# Patient Record
Sex: Male | Born: 1980 | Race: White | Hispanic: No
Health system: Southern US, Community
[De-identification: ages and names within clinical notes are randomized; demographics above are authoritative.]

## PROBLEM LIST (undated history)

## (undated) DIAGNOSIS — F1910 Other psychoactive substance abuse, uncomplicated: Secondary | ICD-10-CM

## (undated) DIAGNOSIS — B958 Unspecified staphylococcus as the cause of diseases classified elsewhere: Secondary | ICD-10-CM

## (undated) DIAGNOSIS — F419 Anxiety disorder, unspecified: Secondary | ICD-10-CM

## (undated) DIAGNOSIS — B1920 Unspecified viral hepatitis C without hepatic coma: Secondary | ICD-10-CM

## (undated) HISTORY — PX: FRACTURE SURGERY: SHX138

## (undated) HISTORY — PX: DENTAL SURGERY: SHX609

## (undated) HISTORY — PX: TONSILLECTOMY: SUR1361

---
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
## 2005-11-04 ENCOUNTER — Emergency Department (HOSPITAL_COMMUNITY): Admission: EM | Admit: 2005-11-04 | Discharge: 2005-11-04 | Payer: Self-pay | Admitting: Emergency Medicine

## 2005-11-05 ENCOUNTER — Emergency Department (HOSPITAL_COMMUNITY): Admission: EM | Admit: 2005-11-05 | Discharge: 2005-11-05 | Payer: Self-pay | Admitting: Emergency Medicine

## 2008-06-15 ENCOUNTER — Emergency Department (HOSPITAL_COMMUNITY): Admission: EM | Admit: 2008-06-15 | Discharge: 2008-06-16 | Payer: Self-pay | Admitting: Emergency Medicine

## 2009-04-13 ENCOUNTER — Emergency Department (HOSPITAL_COMMUNITY): Admission: EM | Admit: 2009-04-13 | Discharge: 2009-04-14 | Payer: Self-pay | Admitting: Emergency Medicine

## 2009-04-14 ENCOUNTER — Inpatient Hospital Stay (HOSPITAL_COMMUNITY): Admission: RE | Admit: 2009-04-14 | Discharge: 2009-04-17 | Payer: Self-pay | Admitting: Psychiatry

## 2009-04-14 ENCOUNTER — Ambulatory Visit: Payer: Self-pay | Admitting: Psychiatry

## 2009-04-18 ENCOUNTER — Emergency Department (HOSPITAL_COMMUNITY): Admission: EM | Admit: 2009-04-18 | Discharge: 2009-04-18 | Payer: Self-pay | Admitting: Emergency Medicine

## 2009-10-12 ENCOUNTER — Emergency Department (HOSPITAL_COMMUNITY): Admission: EM | Admit: 2009-10-12 | Discharge: 2009-10-13 | Payer: Self-pay | Admitting: Emergency Medicine

## 2009-10-13 ENCOUNTER — Emergency Department (HOSPITAL_COMMUNITY): Admission: EM | Admit: 2009-10-13 | Discharge: 2009-10-13 | Payer: Self-pay | Admitting: Emergency Medicine

## 2009-12-22 ENCOUNTER — Emergency Department (HOSPITAL_COMMUNITY): Admission: EM | Admit: 2009-12-22 | Discharge: 2009-12-23 | Payer: Self-pay | Admitting: Emergency Medicine

## 2009-12-23 ENCOUNTER — Emergency Department (HOSPITAL_COMMUNITY): Admission: EM | Admit: 2009-12-23 | Discharge: 2009-12-23 | Payer: Self-pay | Admitting: Emergency Medicine

## 2009-12-25 ENCOUNTER — Emergency Department (HOSPITAL_COMMUNITY): Admission: EM | Admit: 2009-12-25 | Discharge: 2009-12-25 | Payer: Self-pay | Admitting: Emergency Medicine

## 2010-05-09 LAB — BASIC METABOLIC PANEL
BUN: 11 mg/dL (ref 6–23)
CO2: 26 mEq/L (ref 19–32)
Calcium: 8.9 mg/dL (ref 8.4–10.5)
Chloride: 104 mEq/L (ref 96–112)
Chloride: 105 mEq/L (ref 96–112)
Creatinine, Ser: 0.77 mg/dL (ref 0.4–1.5)
GFR calc Af Amer: >60 mL/min (ref >60)
GFR calc Af Amer: >60 mL/min (ref >60)
GFR calc non Af Amer: >60 mL/min (ref >60)
Glucose, Bld: 91 mg/dL (ref 70–99)
Potassium: 3.7 mEq/L (ref 3.5–5.1)
Potassium: 4 mEq/L (ref 3.5–5.1)
Sodium: 137 mEq/L (ref 135–145)

## 2010-05-09 LAB — DIFFERENTIAL
Eosinophils Absolute: 0.2 10*3/uL (ref 0.0–0.7)
Eosinophils Relative: 1 % (ref 0–5)
Eosinophils Relative: 2 % (ref 0–5)
Lymphocytes Relative: 12 % (ref 12–46)
Lymphs Abs: 1.8 10*3/uL (ref 0.7–4.0)
Lymphs Abs: 1.8 10*3/uL (ref 0.7–4.0)
Monocytes Absolute: 1.1 10*3/uL — ABNORMAL HIGH (ref 0.1–1.0)
Monocytes Relative: 4 % (ref 3–12)

## 2010-05-09 LAB — CBC
HCT: 42.3 % (ref 39.0–52.0)
Hemoglobin: 14.8 g/dL (ref 13.0–17.0)
MCH: 31.7 pg (ref 26.0–34.0)
MCV: 91.3 fL (ref 78.0–100.0)
MCV: 91.5 fL (ref 78.0–100.0)
Platelets: 140 10*3/uL — ABNORMAL LOW (ref 150–400)
RBC: 4.63 MIL/uL (ref 4.22–5.81)
RBC: 4.68 MIL/uL (ref 4.22–5.81)
WBC: 15.7 10*3/uL — ABNORMAL HIGH (ref 4.0–10.5)

## 2010-05-09 LAB — CULTURE, BLOOD (ROUTINE X 2)

## 2010-05-11 LAB — CBC
MCH: 31.3 pg (ref 26.0–34.0)
MCHC: 34.6 g/dL (ref 30.0–36.0)
Platelets: 177 10*3/uL (ref 150–400)
Platelets: 188 10*3/uL (ref 150–400)
RBC: 4.88 MIL/uL (ref 4.22–5.81)
RDW: 13.2 % (ref 11.5–15.5)
RDW: 13.5 % (ref 11.5–15.5)
WBC: 7.7 10*3/uL (ref 4.0–10.5)

## 2010-05-11 LAB — COMPREHENSIVE METABOLIC PANEL
ALT: 25 U/L (ref 0–53)
Albumin: 4.6 g/dL (ref 3.5–5.2)
Alkaline Phosphatase: 55 U/L (ref 39–117)
Calcium: 9.2 mg/dL (ref 8.4–10.5)
GFR calc Af Amer: >60 mL/min (ref >60)
Potassium: 3.9 mEq/L (ref 3.5–5.1)
Sodium: 140 mEq/L (ref 135–145)
Total Protein: 7.2 g/dL (ref 6.0–8.3)

## 2010-05-11 LAB — DIFFERENTIAL
Basophils Absolute: 0 10*3/uL (ref 0.0–0.1)
Basophils Relative: 0 % (ref 0–1)
Basophils Relative: 1 % (ref 0–1)
Eosinophils Absolute: 0.2 10*3/uL (ref 0.0–0.7)
Eosinophils Absolute: 0.3 10*3/uL (ref 0.0–0.7)
Lymphs Abs: 2.3 10*3/uL (ref 0.7–4.0)
Lymphs Abs: 3 10*3/uL (ref 0.7–4.0)
Monocytes Absolute: 0.5 10*3/uL (ref 0.1–1.0)
Monocytes Relative: 6 % (ref 3–12)
Neutrophils Relative %: 64 % (ref 43–77)

## 2010-05-11 LAB — RAPID URINE DRUG SCREEN, HOSP PERFORMED
Amphetamines: NOT DETECTED
Amphetamines: NOT DETECTED
Barbiturates: NOT DETECTED
Benzodiazepines: POSITIVE — AB
Cocaine: NOT DETECTED
Opiates: POSITIVE — AB
Tetrahydrocannabinol: POSITIVE — AB

## 2010-05-11 LAB — ETHANOL
Alcohol, Ethyl (B): 111 mg/dL — ABNORMAL HIGH (ref 0–10)
Alcohol, Ethyl (B): <5 mg/dL (ref 0–10)

## 2010-05-11 LAB — BASIC METABOLIC PANEL
BUN: 11 mg/dL (ref 6–23)
CO2: 27 mEq/L (ref 19–32)
Calcium: 9.6 mg/dL (ref 8.4–10.5)
Creatinine, Ser: 1.02 mg/dL (ref 0.4–1.5)
GFR calc Af Amer: >60 mL/min (ref >60)

## 2010-05-16 LAB — COMPREHENSIVE METABOLIC PANEL
ALT: 24 U/L (ref 0–53)
AST: 21 U/L (ref 0–37)
Alkaline Phosphatase: 51 U/L (ref 39–117)
Alkaline Phosphatase: 57 U/L (ref 39–117)
BUN: 11 mg/dL (ref 6–23)
CO2: 27 mEq/L (ref 19–32)
Calcium: 9.6 mg/dL (ref 8.4–10.5)
Chloride: 105 mEq/L (ref 96–112)
Creatinine, Ser: 0.83 mg/dL (ref 0.4–1.5)
GFR calc Af Amer: >60 mL/min (ref >60)
GFR calc non Af Amer: >60 mL/min (ref >60)
GFR calc non Af Amer: >60 mL/min (ref >60)
Glucose, Bld: 99 mg/dL (ref 70–99)
Potassium: 3.5 mEq/L (ref 3.5–5.1)
Potassium: 4.3 mEq/L (ref 3.5–5.1)
Sodium: 140 mEq/L (ref 135–145)
Total Bilirubin: 0.2 mg/dL — ABNORMAL LOW (ref 0.3–1.2)

## 2010-05-16 LAB — CBC
HCT: 42.1 % (ref 39.0–52.0)
Hemoglobin: 14.6 g/dL (ref 13.0–17.0)
Hemoglobin: 15.6 g/dL (ref 13.0–17.0)
MCHC: 34.7 g/dL (ref 30.0–36.0)
MCV: 92 fL (ref 78.0–100.0)
Platelets: 175 10*3/uL (ref 150–400)
RBC: 4.93 MIL/uL (ref 4.22–5.81)
WBC: 10.4 10*3/uL (ref 4.0–10.5)
WBC: 8 10*3/uL (ref 4.0–10.5)

## 2010-05-16 LAB — LIPASE, BLOOD: Lipase: 37 U/L (ref 11–59)

## 2010-05-16 LAB — ETHANOL
Alcohol, Ethyl (B): <5 mg/dL (ref 0–10)
Alcohol, Ethyl (B): <5 mg/dL (ref 0–10)

## 2010-05-16 LAB — POCT I-STAT, CHEM 8
BUN: 8 mg/dL (ref 6–23)
Calcium, Ion: 1.17 mmol/L (ref 1.12–1.32)
Chloride: 104 mEq/L (ref 96–112)
Creatinine, Ser: 0.9 mg/dL (ref 0.4–1.5)
Glucose, Bld: 100 mg/dL — ABNORMAL HIGH (ref 70–99)
TCO2: 29 mmol/L (ref 0–100)

## 2010-05-16 LAB — DIFFERENTIAL
Basophils Absolute: 0 10*3/uL (ref 0.0–0.1)
Basophils Relative: 0 % (ref 0–1)
Basophils Relative: 0 % (ref 0–1)
Eosinophils Absolute: 0 10*3/uL (ref 0.0–0.7)
Eosinophils Relative: 0 % (ref 0–5)
Lymphocytes Relative: 37 % (ref 12–46)
Lymphs Abs: 1.6 10*3/uL (ref 0.7–4.0)
Monocytes Relative: 6 % (ref 3–12)
Neutro Abs: 4.2 10*3/uL (ref 1.7–7.7)
Neutrophils Relative %: 52 % (ref 43–77)

## 2010-05-16 LAB — RAPID URINE DRUG SCREEN, HOSP PERFORMED
Barbiturates: NOT DETECTED
Barbiturates: NOT DETECTED
Benzodiazepines: POSITIVE — AB
Cocaine: NOT DETECTED
Cocaine: NOT DETECTED
Opiates: NOT DETECTED

## 2010-06-06 LAB — DIFFERENTIAL
Basophils Absolute: 0 10*3/uL (ref 0.0–0.1)
Eosinophils Relative: 6 % — ABNORMAL HIGH (ref 0–5)
Lymphocytes Relative: 35 % (ref 12–46)
Lymphs Abs: 2.6 10*3/uL (ref 0.7–4.0)
Neutro Abs: 4 10*3/uL (ref 1.7–7.7)

## 2010-06-06 LAB — URINALYSIS, ROUTINE W REFLEX MICROSCOPIC
Glucose, UA: NEGATIVE mg/dL
Nitrite: NEGATIVE
Specific Gravity, Urine: 1.02 (ref 1.005–1.030)
pH: 6.5 (ref 5.0–8.0)

## 2010-06-06 LAB — CBC
HCT: 44 % (ref 39.0–52.0)
MCHC: 35.5 g/dL (ref 30.0–36.0)
MCV: 90.8 fL (ref 78.0–100.0)
Platelets: 176 10*3/uL (ref 150–400)

## 2010-06-06 LAB — COMPREHENSIVE METABOLIC PANEL
AST: 18 U/L (ref 0–37)
Albumin: 4.5 g/dL (ref 3.5–5.2)
BUN: 7 mg/dL (ref 6–23)
CO2: 29 mEq/L (ref 19–32)
Calcium: 9.6 mg/dL (ref 8.4–10.5)
Chloride: 102 mEq/L (ref 96–112)
Creatinine, Ser: 0.88 mg/dL (ref 0.4–1.5)
GFR calc Af Amer: >60 mL/min (ref >60)
GFR calc non Af Amer: >60 mL/min (ref >60)
Total Bilirubin: 0.3 mg/dL (ref 0.3–1.2)
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
## 2011-02-25 IMAGING — CR DG ELBOW COMPLETE 3+V*R*
4 series · 4 of 4 positions shown · non-contrast
Comparison: [DATE]

CLINICAL DATA: Insect bite.  Redness.  Cellulitis.

RIGHT ELBOW - COMPLETE 3+ VIEW

[x elbow joint ap right]
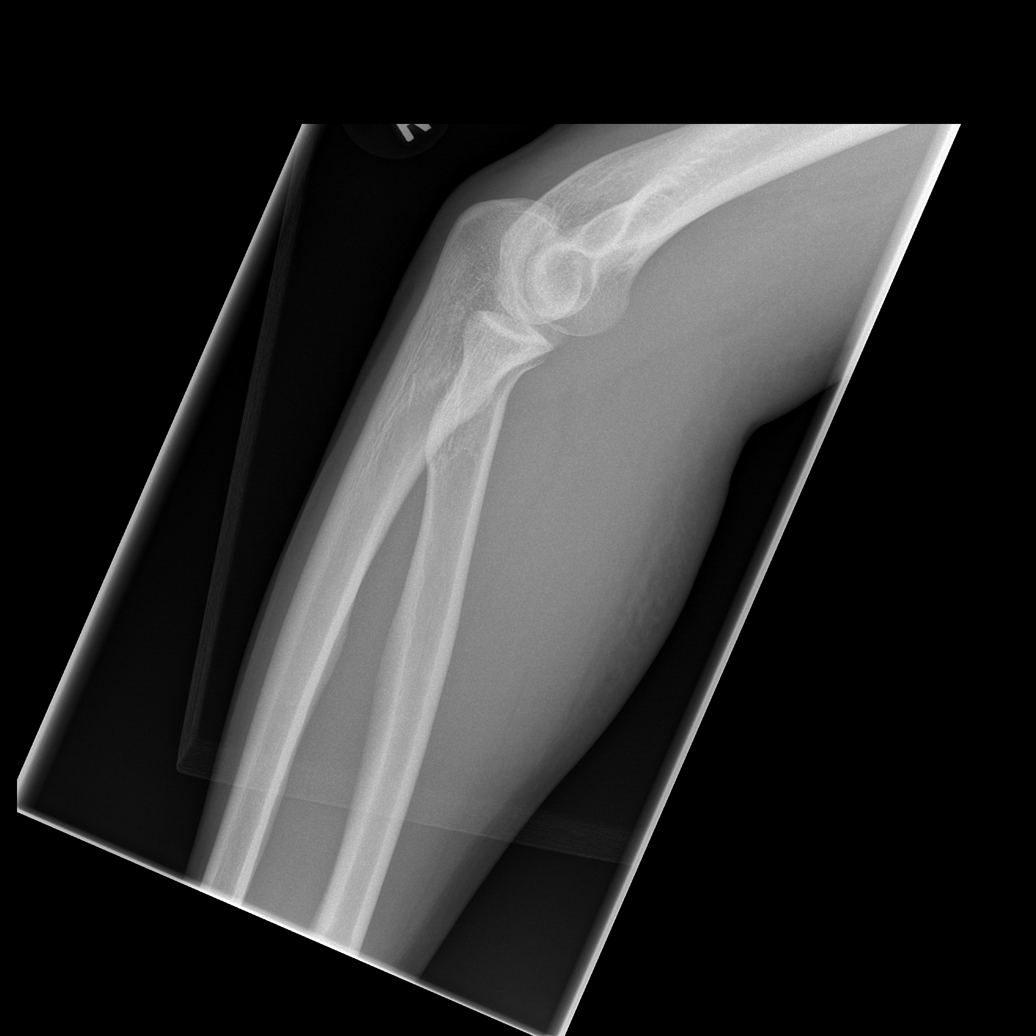

[x elbow joint obl. right]
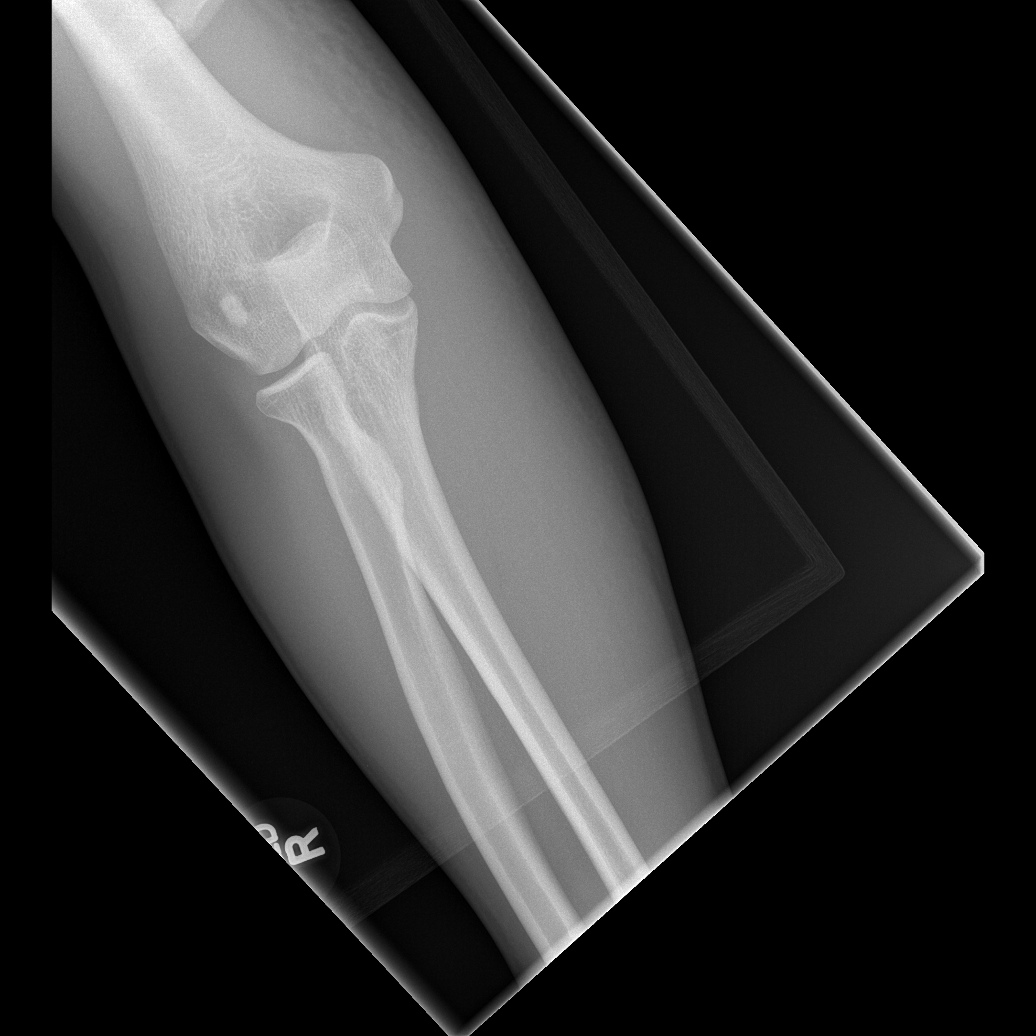

[x elbow joint lat right (1 of 2)]
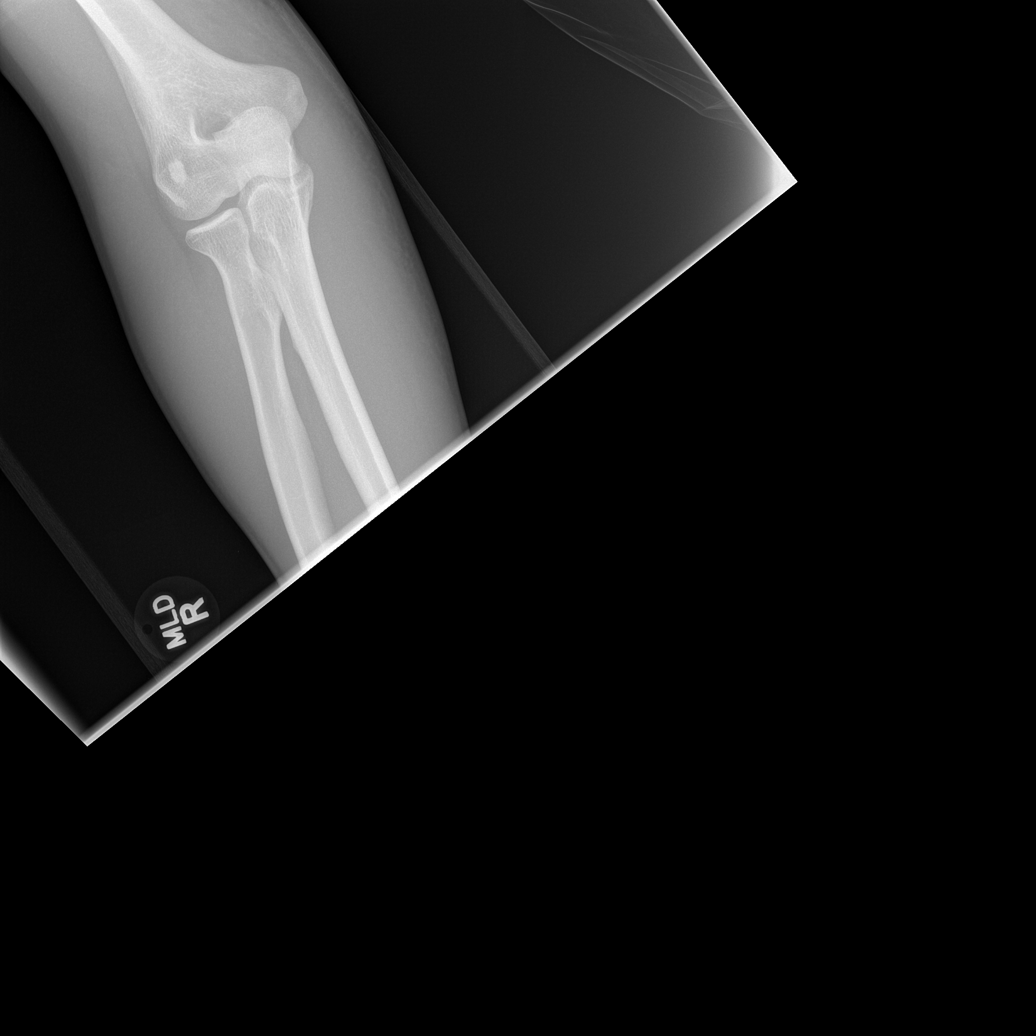

[x elbow joint lat right (2 of 2)]
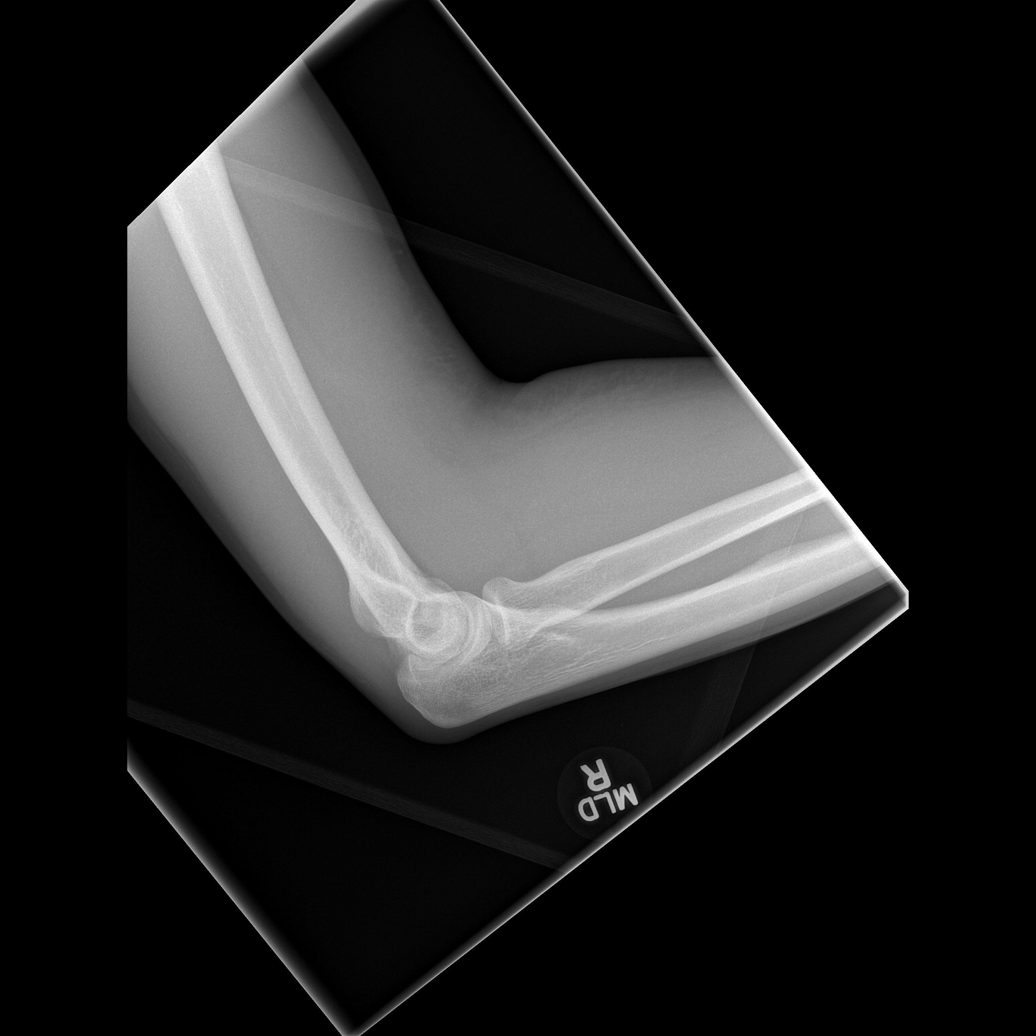

[4 of 4 positions shown; findings below may reference images not displayed]

FINDINGS: No discernible joint effusion.  No acute bony finding.
Benign bone island in the lateral humerus.
IMPRESSION: No acute finding

## 2012-03-22 ENCOUNTER — Emergency Department (HOSPITAL_COMMUNITY)
Admission: EM | Admit: 2012-03-22 | Discharge: 2012-03-22 | Disposition: A | Discharge status: Home / Self Care | Payer: Self-pay | Attending: Emergency Medicine | Admitting: Emergency Medicine

## 2012-03-22 DIAGNOSIS — K089 Disorder of teeth and supporting structures, unspecified: Secondary | ICD-10-CM | POA: Insufficient documentation

## 2012-03-22 DIAGNOSIS — K0381 Cracked tooth: Secondary | ICD-10-CM | POA: Insufficient documentation

## 2012-03-22 DIAGNOSIS — K0889 Other specified disorders of teeth and supporting structures: Secondary | ICD-10-CM

## 2012-03-22 MED ORDER — OXYCODONE-ACETAMINOPHEN 5-325 MG PO TABS: 1.0000 | ORAL_TABLET | ORAL | Status: DC | PRN

## 2012-03-22 MED ORDER — OXYCODONE-ACETAMINOPHEN 5-325 MG PO TABS
2.0000 | ORAL_TABLET | Freq: Once | ORAL | Status: AC
  Administered 2012-03-22: 2 via ORAL
  Filled 2012-03-22: qty 2

## 2012-03-22 MED ORDER — BUPIVACAINE HCL 0.25 % IJ SOLN: 5.0000 mL | Freq: Once | INTRAMUSCULAR | Status: DC

## 2012-03-22 MED ORDER — BUPIVACAINE-EPINEPHRINE PF 0.5-1:200000 % IJ SOLN
1.8000 mL | Freq: Once | INTRAMUSCULAR | Status: DC
  Filled 2012-03-22: qty 1.8

## 2012-03-22 MED ORDER — CLINDAMYCIN HCL 150 MG PO CAPS: 300.0000 mg | ORAL_CAPSULE | Freq: Three times a day (TID) | ORAL | Status: DC

## 2012-03-22 NOTE — ED Notes (Signed)
Pt c/o dental pain to L side of mouth. Pt states he thinks his wisdom teeth are infected. Pt reports pain for last 2-3 weeks. Pt states he does not have a dentist. Pt has been taking Motrin and Goody's with no relief. Pt also states that he has a tooth on the R side of his mouth where the filling came out and this area is also painful.

## 2012-03-22 NOTE — ED Provider Notes (Signed)
Medical screening examination/treatment/procedure(s) were performed by non-physician practitioner and as supervising physician I was immediately available for consultation/collaboration.  Gerhard Munch, MD 03/22/12 [phone]

## 2012-03-22 NOTE — ED Provider Notes (Signed)
History   This chart was scribed for non-physician practitioner working with Gerhard Munch, MD by Frederik Pear, ED Scribe. This patient was seen in room WTR9/WTR9 and the patient's care was started at 2008.   CSN: [id number]  Arrival date & time 03/22/12  1478   First MD Initiated Contact with Patient 03/22/12 2008      Chief Complaint  Patient presents with  . Dental Pain    (Consider location/radiation/quality/duration/timing/severity/associated sxs/prior treatment) HPI  Danny Campbell is a 32 y.o. male who presents to the Emergency Department with a chief complaint of gradually worsening left-sided dental pain that began 3 weeks ago. He reports that he believes that his top and bottom wisdom are infected. He also complains of a broken tooth to the top right of his mouth that began after ate a piece of candy that broke his filling. He reports that small pieces of the tooth have been breaking off over the past few weeks. He states he has been treating with ibuprofen and BC powder at home that was providing relief at first, but is no longer providing relief. 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  No past medical history on file.  No past surgical history on file.  No family history on file.  History  Substance Use Topics  . Smoking status: Not on file  . Smokeless tobacco: Not on file  . Alcohol Use: Not on file      Review of Systems A complete 10 system review of systems was obtained and all systems are negative except as noted in the HPI and PMH.   Allergies  Benadryl and Penicillins  Home Medications   Current Outpatient Rx  Name  Route  Sig  Dispense  Refill  . ASPIRIN-ACETAMINOPHEN-CAFFEINE 500-325-65 MG PO PACK   Oral   Take 1 Package by mouth every 6 (six) hours as needed. For pain         . IBUPROFEN 200 MG PO TABS   Oral   Take 800 mg by mouth every 6 (six) hours as needed. For pain           BP 163/97  Pulse 88   Temp 98.1 F (36.7 C) (Oral)  Resp 16  SpO2 99%  Physical Exam  Nursing note and vitals reviewed. Constitutional: He is oriented to person, place, and time. He appears well-developed and well-nourished. No distress.  HENT:  Head: Normocephalic and atraumatic.  Mouth/Throat: Uvula is midline. No tonsillar abscesses.         No signs of gingival or para-tonsillar abscesses. Airway is intact.  Eyes: EOM are normal. Pupils are equal, round, and reactive to light.  Neck: Normal range of motion. Neck supple. No tracheal deviation present.  Cardiovascular: Normal rate.   Pulmonary/Chest: Effort normal. No respiratory distress.  Abdominal: Soft. He exhibits no distension.  Musculoskeletal: Normal range of motion. He exhibits no edema.  Neurological: He is alert and oriented to person, place, and time.  Skin: Skin is warm and dry.  Psychiatric: He has a normal mood and affect. His behavior is normal.    ED Course  Procedures (including critical care time)  DIAGNOSTIC STUDIES: Oxygen Saturation is 99% on room air, normal by my interpretation.    COORDINATION OF CARE:  20:20- Discussed planned course of treatment with the patient, including clindamyacin, a dental block, and pain medication, who is agreeable at this time.  20:30- Medication Orders- oxycodone-acetaminophen (percocet/roxicet) 5-325 mg per tablet  2 tablet- once, bupivacaine (marcaine) 0.25% (with pre) injection 5 ml- once.   Labs Reviewed - No data to display No results found.   1. Pain, dental       MDM  32 year old male with uncomplicated dental pain. Treated with dental block and Percocet. Will discharge the patient with clindamycin and Percocet. Encouraged patient to followup with the dentist. Resource have been given.  Patient understands and agrees with the plan. He is stable and ready for discharge.  I personally performed the services described in this documentation, which was scribed in my presence. The  recorded information has been reviewed and is accurate.         Roxy Horseman, PA-C 03/22/12 2053

## 2012-03-22 NOTE — ED Notes (Signed)
Pt has a ride home.  

## 2012-03-29 ENCOUNTER — Encounter (HOSPITAL_COMMUNITY): Payer: Self-pay

## 2012-03-29 ENCOUNTER — Emergency Department (HOSPITAL_COMMUNITY)
Admission: EM | Admit: 2012-03-29 | Discharge: 2012-03-29 | Disposition: A | Discharge status: Home / Self Care | Payer: Self-pay | Attending: Emergency Medicine | Admitting: Emergency Medicine

## 2012-03-29 DIAGNOSIS — F17200 Nicotine dependence, unspecified, uncomplicated: Secondary | ICD-10-CM | POA: Insufficient documentation

## 2012-03-29 DIAGNOSIS — K08409 Partial loss of teeth, unspecified cause, unspecified class: Secondary | ICD-10-CM

## 2012-03-29 DIAGNOSIS — K089 Disorder of teeth and supporting structures, unspecified: Secondary | ICD-10-CM | POA: Insufficient documentation

## 2012-03-29 DIAGNOSIS — G8918 Other acute postprocedural pain: Secondary | ICD-10-CM | POA: Insufficient documentation

## 2012-03-29 DIAGNOSIS — K122 Cellulitis and abscess of mouth: Secondary | ICD-10-CM

## 2012-03-29 DIAGNOSIS — Y849 Medical procedure, unspecified as the cause of abnormal reaction of the patient, or of later complication, without mention of misadventure at the time of the procedure: Secondary | ICD-10-CM | POA: Insufficient documentation

## 2012-03-29 DIAGNOSIS — Z79899 Other long term (current) drug therapy: Secondary | ICD-10-CM | POA: Insufficient documentation

## 2012-03-29 DIAGNOSIS — T8140XA Infection following a procedure, unspecified, initial encounter: Secondary | ICD-10-CM | POA: Insufficient documentation

## 2012-03-29 MED ORDER — BUPIVACAINE-EPINEPHRINE PF 0.5-1:200000 % IJ SOLN
1.8000 mL | Freq: Once | INTRAMUSCULAR | Status: DC
  Filled 2012-03-29: qty 1.8

## 2012-03-29 MED ORDER — OXYCODONE-ACETAMINOPHEN 5-325 MG PO TABS
2.0000 | ORAL_TABLET | Freq: Once | ORAL | Status: AC
  Administered 2012-03-29: 2 via ORAL
  Filled 2012-03-29: qty 2

## 2012-03-29 MED ORDER — CLINDAMYCIN HCL 300 MG PO CAPS: 300.0000 mg | ORAL_CAPSULE | Freq: Three times a day (TID) | ORAL | Status: DC

## 2012-03-29 MED ORDER — BUPIVACAINE HCL 0.5 % IJ SOLN: 1.8000 mL | Freq: Once | INTRAMUSCULAR | Status: DC

## 2012-03-29 MED ORDER — CHLORHEXIDINE GLUCONATE 0.12 % MT SOLN: 15.0000 mL | Freq: Two times a day (BID) | OROMUCOSAL | Status: DC

## 2012-03-29 MED ORDER — KETOROLAC TROMETHAMINE 60 MG/2ML IM SOLN
60.0000 mg | Freq: Once | INTRAMUSCULAR | Status: AC
  Administered 2012-03-29: 60 mg via INTRAMUSCULAR
  Filled 2012-03-29: qty 2

## 2012-03-29 MED ORDER — OXYCODONE-ACETAMINOPHEN 10-325 MG PO TABS: 1.0000 | ORAL_TABLET | Freq: Four times a day (QID) | ORAL | Status: DC | PRN

## 2012-03-29 NOTE — ED Notes (Signed)
Patient c/o dental pain s/p 6 tooth extractions that were done  5 days ago.

## 2012-03-31 NOTE — ED Provider Notes (Signed)
History     CSN: [id number]  Arrival date & time 03/29/12  1142   First MD Initiated Contact with Patient 03/29/12 1224      Chief Complaint  Patient presents with  . Dental Pain    (Consider location/radiation/quality/duration/timing/severity/associated sxs/prior treatment) The history is provided by the patient and medical records. No language interpreter was used.   32 y/o male with hx poor dentition reports to ed c/o dental pain after tooth extraction. Patient was seen here at the ED on 03/22/2012 for dental pain.  He was seen by Dr. Hubert Azure for tooth extraction and complains today of severe pain and purulent discharge.  Pateint states that he has been taking clindamysin as directed.  He has not been smoking.  Patient states that his sutures fell out the day after extraction. He states that he had an oral temp or 102.4 F last night.  Denies difficulty breathing or swallowing.  History reviewed. No pertinent past medical history.  Past Surgical History  Procedure Date  . Dental surgery     History reviewed. No pertinent family history.  History  Substance Use Topics  . Smoking status: Current Every Day Smoker  . Smokeless tobacco: Never Used  . Alcohol Use: No      Review of Systems Ten systems reviewed and are negative for acute change, except as noted in the HPI.   Allergies  Benadryl and Penicillins  Home Medications   Current Outpatient Rx  Name  Route  Sig  Dispense  Refill  . ASPIRIN-ACETAMINOPHEN-CAFFEINE 500-325-65 MG PO PACK   Oral   Take 1 Package by mouth every 6 (six) hours as needed. For pain         . CLINDAMYCIN HCL 150 MG PO CAPS   Oral   Take 2 capsules (300 mg total) by mouth 3 (three) times daily.   60 capsule   0   . IBUPROFEN 200 MG PO TABS   Oral   Take 800 mg by mouth every 6 (six) hours as needed. For pain         . OXYCODONE-ACETAMINOPHEN 5-325 MG PO TABS   Oral   Take 1 tablet by mouth every 4 (four) hours as needed  for pain.   15 tablet   0   . CHLORHEXIDINE GLUCONATE 0.12 % MT SOLN   Mouth/Throat   Use as directed 15 mLs in the mouth or throat 2 (two) times daily.   120 mL   0   . CLINDAMYCIN HCL 300 MG PO CAPS   Oral   Take 1 capsule (300 mg total) by mouth 3 (three) times daily.   30 capsule   0   . OXYCODONE-ACETAMINOPHEN 10-325 MG PO TABS   Oral   Take 1-2 tablets by mouth every 6 (six) hours as needed for pain.   8 tablet   0     BP 136/79  Pulse 77  Temp 97.5 F (36.4 C) (Oral)  Resp 18  SpO2 98%  Physical Exam  Nursing note and vitals reviewed. Constitutional:       Appears in pain and non-toxic. Smells of cigarette smoke.    HENT:  Head: Normocephalic and atraumatic. No trismus in the jaw.  Mouth/Throat: Uvula is midline.         Five open toot sockets.  There is purulent drainage from the lower left sockets. No trismus.  Uvula midline.  No extension to the pharynx. Airway patent.  Eyes: Conjunctivae normal are normal.  No scleral icterus.  Neck: Normal range of motion. Neck supple.  Cardiovascular: Normal rate, regular rhythm and normal heart sounds.   Pulmonary/Chest: Effort normal and breath sounds normal. No respiratory distress.  Abdominal: Soft. There is no tenderness.  Musculoskeletal: He exhibits no edema.  Neurological: He is alert.  Skin: Skin is warm and dry.  Psychiatric: His behavior is normal.    ED Course  Procedures (including critical care time)  Labs Reviewed - No data to display No results found.   1. Status post tooth extraction   2. Infection of mouth       MDM  Patient with purulent dc and pain.  I have messaged Dr. Jeanice Lim about his visit.  Ptient is to continue taking his clindamycin .  Will add chlorhexidine mouth was and refill pain meds.  Patient will follow up with oral surgeon tomorrow.  At this time there does not appear to be any evidence of an acute emergency medical condition and the patient appears stable for discharge  with appropriate outpatient follow up.Diagnosis was discussed with patient who verbalizes understanding and is agreeable to discharge. Pt case discussed with Dr. Patria Mane who agrees with my plan.        Arthor Captain, PA-C 03/31/12 1247

## 2012-04-01 NOTE — ED Provider Notes (Signed)
Medical screening examination/treatment/procedure(s) were conducted as a shared visit with non-physician practitioner(s) and myself.  I personally evaluated the patient during the encounter  Will place back on abx. Home with pain medicine. Personally irrigated food particles out of back left lower dental extraction site. Oral surgery follow up tomorrow for ongoing pain. Pt will need to call for appointment  Lyanne Co, MD 04/01/12 [phone]

## 2012-05-19 ENCOUNTER — Encounter (HOSPITAL_COMMUNITY): Payer: Self-pay | Admitting: *Deleted

## 2012-05-19 ENCOUNTER — Encounter (HOSPITAL_COMMUNITY): Payer: Self-pay

## 2012-05-19 ENCOUNTER — Inpatient Hospital Stay (HOSPITAL_COMMUNITY)
Admission: AD | Admit: 2012-05-19 | Discharge: 2012-05-21 | DRG: 881 | Disposition: A | Discharge status: Home / Self Care | Payer: Medicaid Other | Source: Intra-hospital | Attending: Psychiatry | Admitting: Psychiatry

## 2012-05-19 ENCOUNTER — Emergency Department (HOSPITAL_COMMUNITY)
Admission: EM | Admit: 2012-05-19 | Discharge: 2012-05-19 | Disposition: A | Discharge status: Other Acute Inpatient Hospital | Payer: Self-pay | Attending: Emergency Medicine | Admitting: Emergency Medicine

## 2012-05-19 DIAGNOSIS — Z79899 Other long term (current) drug therapy: Secondary | ICD-10-CM | POA: Insufficient documentation

## 2012-05-19 DIAGNOSIS — Z8659 Personal history of other mental and behavioral disorders: Secondary | ICD-10-CM | POA: Insufficient documentation

## 2012-05-19 DIAGNOSIS — Z8619 Personal history of other infectious and parasitic diseases: Secondary | ICD-10-CM | POA: Insufficient documentation

## 2012-05-19 DIAGNOSIS — F329 Major depressive disorder, single episode, unspecified: Secondary | ICD-10-CM | POA: Insufficient documentation

## 2012-05-19 DIAGNOSIS — F4321 Adjustment disorder with depressed mood: Principal | ICD-10-CM | POA: Diagnosis present

## 2012-05-19 DIAGNOSIS — F17200 Nicotine dependence, unspecified, uncomplicated: Secondary | ICD-10-CM | POA: Insufficient documentation

## 2012-05-19 DIAGNOSIS — B1920 Unspecified viral hepatitis C without hepatic coma: Secondary | ICD-10-CM | POA: Diagnosis present

## 2012-05-19 DIAGNOSIS — Z7901 Long term (current) use of anticoagulants: Secondary | ICD-10-CM | POA: Insufficient documentation

## 2012-05-19 DIAGNOSIS — F3289 Other specified depressive episodes: Secondary | ICD-10-CM | POA: Insufficient documentation

## 2012-05-19 DIAGNOSIS — R45851 Suicidal ideations: Secondary | ICD-10-CM | POA: Insufficient documentation

## 2012-05-19 HISTORY — DX: Other psychoactive substance abuse, uncomplicated: F19.10

## 2012-05-19 HISTORY — DX: Anxiety disorder, unspecified: F41.9

## 2012-05-19 HISTORY — DX: Unspecified staphylococcus as the cause of diseases classified elsewhere: B95.8

## 2012-05-19 HISTORY — DX: Unspecified viral hepatitis C without hepatic coma: B19.20

## 2012-05-19 LAB — CBC
Hemoglobin: 15.1 g/dL (ref 13.0–17.0)
Platelets: 292 10*3/uL (ref 150–400)
RBC: 4.77 MIL/uL (ref 4.22–5.81)
WBC: 9.6 10*3/uL (ref 4.0–10.5)

## 2012-05-19 LAB — COMPREHENSIVE METABOLIC PANEL
ALT: 199 U/L — ABNORMAL HIGH (ref 0–53)
AST: 67 U/L — ABNORMAL HIGH (ref 0–37)
Alkaline Phosphatase: 321 U/L — ABNORMAL HIGH (ref 39–117)
CO2: 28 mEq/L (ref 19–32)
Calcium: 9.9 mg/dL (ref 8.4–10.5)
Chloride: 95 mEq/L — ABNORMAL LOW (ref 96–112)
GFR calc Af Amer: >90 mL/min (ref >90)
GFR calc non Af Amer: >90 mL/min (ref >90)
Glucose, Bld: 91 mg/dL (ref 70–99)
Potassium: 3.9 mEq/L (ref 3.5–5.1)
Sodium: 134 mEq/L — ABNORMAL LOW (ref 135–145)
Total Bilirubin: 0.4 mg/dL (ref 0.3–1.2)

## 2012-05-19 LAB — RAPID URINE DRUG SCREEN, HOSP PERFORMED
Amphetamines: NOT DETECTED
Barbiturates: NOT DETECTED
Opiates: POSITIVE — AB
Tetrahydrocannabinol: NOT DETECTED

## 2012-05-19 LAB — URINALYSIS, ROUTINE W REFLEX MICROSCOPIC
Glucose, UA: NEGATIVE mg/dL
Hgb urine dipstick: NEGATIVE
Ketones, ur: NEGATIVE mg/dL
Leukocytes, UA: NEGATIVE
Protein, ur: NEGATIVE mg/dL
pH: 6 (ref 5.0–8.0)

## 2012-05-19 LAB — PROTIME-INR: Prothrombin Time: 29 seconds — ABNORMAL HIGH (ref 11.6–15.2)

## 2012-05-19 LAB — ACETAMINOPHEN LEVEL: Acetaminophen (Tylenol), Serum: <15 ug/mL (ref 10–30)

## 2012-05-19 MED ORDER — WARFARIN SODIUM 4 MG PO TABS
4.0000 mg | ORAL_TABLET | Freq: Every day | ORAL | Status: DC
  Administered 2012-05-20: 4 mg via ORAL
  Filled 2012-05-19 (×2): qty 1

## 2012-05-19 MED ORDER — LORAZEPAM 1 MG PO TABS: 1.0000 mg | ORAL_TABLET | Freq: Three times a day (TID) | ORAL | Status: DC | PRN

## 2012-05-19 MED ORDER — IBUPROFEN 600 MG PO TABS: 600.0000 mg | ORAL_TABLET | Freq: Three times a day (TID) | ORAL | Status: DC | PRN

## 2012-05-19 MED ORDER — ALUM & MAG HYDROXIDE-SIMETH 200-200-20 MG/5ML PO SUSP: 15.0000 mL | Freq: Four times a day (QID) | ORAL | Status: DC | PRN

## 2012-05-19 MED ORDER — MAGNESIUM HYDROXIDE 400 MG/5ML PO SUSP: 30.0000 mL | Freq: Every day | ORAL | Status: DC | PRN

## 2012-05-19 MED ORDER — NICOTINE 14 MG/24HR TD PT24
14.0000 mg | MEDICATED_PATCH | Freq: Every day | TRANSDERMAL | Status: DC
  Filled 2012-05-19 (×3): qty 1

## 2012-05-19 MED ORDER — ONDANSETRON HCL 4 MG PO TABS: 4.0000 mg | ORAL_TABLET | Freq: Three times a day (TID) | ORAL | Status: DC | PRN

## 2012-05-19 MED ORDER — ACETAMINOPHEN 325 MG PO TABS: 650.0000 mg | ORAL_TABLET | Freq: Four times a day (QID) | ORAL | Status: DC | PRN

## 2012-05-19 MED ORDER — TRAZODONE HCL 50 MG PO TABS
50.0000 mg | ORAL_TABLET | Freq: Every evening | ORAL | Status: DC | PRN
  Filled 2012-05-19 (×5): qty 1

## 2012-05-19 MED ORDER — ALUM & MAG HYDROXIDE-SIMETH 200-200-20 MG/5ML PO SUSP: 30.0000 mL | ORAL | Status: DC | PRN

## 2012-05-19 MED ORDER — WARFARIN - PHARMACIST DOSING INPATIENT
Freq: Every day | Status: DC
  Filled 2012-05-19 (×15): qty 1
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
## 2012-05-19 NOTE — Tx Team (Signed)
Initial Interdisciplinary Treatment Plan  PATIENT STRENGTHS: (choose at least two) Ability for insight General fund of knowledge Supportive family/friends  PATIENT STRESSORS: Health problems Legal issue Medication change or noncompliance   PROBLEM LIST: Problem List/Patient Goals Date to be addressed Date deferred Reason deferred Estimated date of resolution  Depression      Anxiety                                                 DISCHARGE CRITERIA:  Ability to meet basic life and health needs Improved stabilization in mood, thinking, and/or behavior  PRELIMINARY DISCHARGE PLAN: Attend PHP/IOP Outpatient therapy  PATIENT/FAMIILY INVOLVEMENT: This treatment plan has been presented to and reviewed with the patient, Danny Campbell.  The patient and family have been given the opportunity to ask questions and make suggestions.  Delos Haring 05/19/2012, 10:49 PM

## 2012-05-19 NOTE — ED Notes (Signed)
MD at bedside. 

## 2012-05-19 NOTE — ED Provider Notes (Signed)
History     CSN: [id number]  Arrival date & time 05/19/12  1244   First MD Initiated Contact with Patient 05/19/12 1340      Chief Complaint  Patient presents with  . 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 Pt states that he just found out that he has hep c, dvt and cellulitis where he reqd iv meds - all during the recent admission at [facility]. Pt was discharged home from that visit (3 week admission), and started feeling really depressed - and y'day started thinking about dying. He has no hx of depression. He has no plan to kill himself - but since he has never felt so "miserable" he is scared and wants professional help. Pt deneis any recent substance abuse. He is s/p antibiotics course. Denies any abd pain, chest pain, dib, cough.  The history is provided by the patient and medical records.    Past Medical History  Diagnosis Date  . Staph infection   . Anxiety   . Hepatitis C   . Substance abuse     Past Surgical History  Procedure Laterality Date  . Dental surgery    . Fracture surgery    . Tonsillectomy      History reviewed. No pertinent family history.  History  Substance Use Topics  . Smoking status: Current Every Day Smoker -- 0.50 packs/day    Types: Cigarettes  . Smokeless tobacco: Never Used  . Alcohol Use: No     Comment: Pt reports being clean for 2 years.      Review of Systems  Constitutional: Negative for activity change and appetite change.  Respiratory: Negative for cough and shortness of breath.   Cardiovascular: Negative for chest pain.  Gastrointestinal: Negative for abdominal pain.  Genitourinary: Negative for dysuria.  Psychiatric/Behavioral: Positive for suicidal ideas and agitation.    Allergies  Benadryl and Penicillins  Home Medications   Current Outpatient Rx  Name  Route  Sig   Dispense  Refill  . acetaminophen (TYLENOL) 500 MG tablet   Oral   Take 500 mg by mouth every 6 (six) hours as needed for pain.         Marland Kitchen alum & mag hydroxide-simeth (MAALOX/MYLANTA) 200-200-20 MG/5ML suspension   Oral   Take 15 mLs by mouth every 6 (six) hours as needed for indigestion.         Marland Kitchen morphine (MS CONTIN) 15 MG 12 hr tablet   Oral   Take 15 mg by mouth 2 (two) times daily.         Marland Kitchen warfarin (COUMADIN) 4 MG tablet   Oral   Take 4 mg by mouth daily.           BP 134/87  Pulse 86  Temp(Src) 98.3 F (36.8 C) (Oral)  Resp 14  Wt 184 lb (83.462 kg)  SpO2 98%  Physical Exam  Constitutional: He is oriented to person, place, and time. He appears well-developed.  HENT:  Head: Normocephalic and atraumatic.  Eyes: Conjunctivae and EOM are normal. Pupils are equal, round, and reactive to light.  Neck: Normal range of motion. Neck supple.  Cardiovascular: Normal rate and regular rhythm.   Pulmonary/Chest: Effort normal and breath sounds normal.  Abdominal: Soft. Bowel sounds are normal. He exhibits no distension. There is no tenderness. There is no rebound and no guarding.  Neurological: He is alert and oriented to  person, place, and time.  Skin: Skin is warm. Rash noted.    ED Course  Procedures (including critical care time)  Labs Reviewed  COMPREHENSIVE METABOLIC PANEL - Abnormal; Notable for the following:    Sodium 134 (*)    Chloride 95 (*)    Total Protein 8.7 (*)    AST 67 (*)    ALT 199 (*)    Alkaline Phosphatase 321 (*)    All other components within normal limits  SALICYLATE LEVEL - Abnormal; Notable for the following:    Salicylate Lvl <2.0 (*)    All other components within normal limits  URINE RAPID DRUG SCREEN (HOSP PERFORMED) - Abnormal; Notable for the following:    Opiates POSITIVE (*)    All other components within normal limits  PROTIME-INR - Abnormal; Notable for the following:    Prothrombin Time 29.0 (*)    INR 2.92 (*)     All other components within normal limits  ACETAMINOPHEN LEVEL  CBC  ETHANOL  URINALYSIS, ROUTINE W REFLEX MICROSCOPIC   No results found.   No diagnosis found.    MDM   Date: 05/19/2012  Rate: 74  Rhythm: normal sinus rhythm  QRS Axis: normal  Intervals: normal  ST/T Wave abnormalities: normal  Conduction Disutrbances: none  Narrative Interpretation: unremarkable  DDx: Depression Bipolar disorder Schizophrenia Substance abuse Suicidal ideation Acute withdrawal  Pt comes in with cc of suicidal thoughts - no active plans. He also has some sx of depression for the past 2 weeks. No clinical dx of depression - as sx only have been going on for 2 weeks - however, with the added suicidal thoughts - we will get telepsych evaluation. If psych clears -will discharge with any meds they might recommend.     Derwood Kaplan, MD 05/19/12 1635
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
## 2012-05-19 NOTE — ED Notes (Signed)
Patient asleep; no s/s of distress noted. Respirations regular and unlabored. 

## 2012-05-19 NOTE — ED Notes (Addendum)
Pt from home accompanied by mother with reports of being hospitalized for 3 weeks due to a staph infection. Pt reports being discharged from [facility] on Sunday and began to feel more depressed over the last several days. Pt reports waking up last night feeling disoriented and anxious with reports of thoughts of wanting to die. Pt denies HI or AV hallucinations. Pt reports never being diagnosed with Depression but does have hx of substance abuse. Pt reports being "clean" for 2 years ago. Pt also reports being diagnosed with a blood clot under right armpit and is on Coumadin.
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
## 2012-05-19 NOTE — Progress Notes (Signed)
Pt requested CSW to send letter to patient lawyer Augustina Mood regarding patient admission to [phone], and fax  [phone].   Catha Gosselin, Theresia Majors  [phone] .05/19/2012 [id number]

## 2012-05-19 NOTE — ED Notes (Signed)
Pt ambulated to Psych ED accompanied by Security with chart and personal belongings, condition stable at time of transfer.

## 2012-05-19 NOTE — ED Provider Notes (Signed)
Accepted at [facility].  Juliet Rude. Rubin Payor, MD 05/19/12 [phone]

## 2012-05-20 DIAGNOSIS — F329 Major depressive disorder, single episode, unspecified: Secondary | ICD-10-CM

## 2012-05-20 LAB — PROTIME-INR
INR: 2.67 — ABNORMAL HIGH (ref 0.00–1.49)
Prothrombin Time: 27.1 seconds — ABNORMAL HIGH (ref 11.6–15.2)

## 2012-05-20 MED ORDER — MAGNESIUM HYDROXIDE 400 MG/5ML PO SUSP: 30.0000 mL | Freq: Every day | ORAL | Status: DC | PRN

## 2012-05-20 MED ORDER — ACETAMINOPHEN 325 MG PO TABS: 650.0000 mg | ORAL_TABLET | Freq: Four times a day (QID) | ORAL | Status: DC | PRN

## 2012-05-20 MED ORDER — DICYCLOMINE HCL 20 MG PO TABS
20.0000 mg | ORAL_TABLET | Freq: Four times a day (QID) | ORAL | Status: DC | PRN
  Administered 2012-05-20: 20 mg via ORAL
  Filled 2012-05-20: qty 1

## 2012-05-20 MED ORDER — CLONIDINE HCL 0.1 MG PO TABS: 0.1000 mg | ORAL_TABLET | Freq: Every day | ORAL | Status: DC

## 2012-05-20 MED ORDER — ALUM & MAG HYDROXIDE-SIMETH 200-200-20 MG/5ML PO SUSP: 30.0000 mL | ORAL | Status: DC | PRN

## 2012-05-20 MED ORDER — CLONIDINE HCL 0.1 MG PO TABS
0.1000 mg | ORAL_TABLET | Freq: Four times a day (QID) | ORAL | Status: DC
  Administered 2012-05-20 – 2012-05-21 (×4): 0.1 mg via ORAL
  Filled 2012-05-20 (×8): qty 1

## 2012-05-20 MED ORDER — METHOCARBAMOL 500 MG PO TABS
500.0000 mg | ORAL_TABLET | Freq: Three times a day (TID) | ORAL | Status: DC | PRN
  Administered 2012-05-20: 500 mg via ORAL
  Filled 2012-05-20: qty 1

## 2012-05-20 MED ORDER — NAPROXEN 500 MG PO TABS: 500.0000 mg | ORAL_TABLET | Freq: Two times a day (BID) | ORAL | Status: DC | PRN

## 2012-05-20 MED ORDER — ONDANSETRON 4 MG PO TBDP: 4.0000 mg | ORAL_TABLET | Freq: Four times a day (QID) | ORAL | Status: DC | PRN

## 2012-05-20 MED ORDER — WARFARIN SODIUM 4 MG PO TABS
4.0000 mg | ORAL_TABLET | Freq: Every day | ORAL | Status: DC
Start: 2012-05-20 — End: 2012-05-20

## 2012-05-20 MED ORDER — ACETAMINOPHEN 500 MG PO TABS: 500.0000 mg | ORAL_TABLET | Freq: Four times a day (QID) | ORAL | Status: DC | PRN

## 2012-05-20 MED ORDER — HYDROXYZINE HCL 25 MG PO TABS
25.0000 mg | ORAL_TABLET | Freq: Four times a day (QID) | ORAL | Status: DC | PRN
  Administered 2012-05-20 (×2): 25 mg via ORAL

## 2012-05-20 MED ORDER — CLONIDINE HCL 0.1 MG PO TABS
0.1000 mg | ORAL_TABLET | ORAL | Status: DC
  Filled 2012-05-20: qty 1

## 2012-05-20 MED ORDER — LOPERAMIDE HCL 2 MG PO CAPS: 2.0000 mg | ORAL_CAPSULE | ORAL | Status: DC | PRN

## 2012-05-20 NOTE — Progress Notes (Signed)
[facility] LCSW Group Therapy  Emotional Regulation  05/20/2012 3:33 PM  Type of Therapy:  Group Therapy  Participation Level:  Did not attend group.   Wynn Banker 05/20/2012, 3:33 PM

## 2012-05-20 NOTE — Tx Team (Signed)
Interdisciplinary Treatment Plan Update   Date Reviewed:  05/20/2012  Time Reviewed:  9:58 AM  Progress in Treatment:   Attending groups: Yes Participating in groups: Yes Taking medication as prescribed: Yes  Tolerating medication: Yes Family/Significant other contact made: Patient  Patient understands diagnosis: Yes  Discussing patient identified problems/goals with staff: Yes Medical problems stabilized or resolved: Yes Denies suicidal/homicidal ideation: Yes Patient has not harmed self or others: Yes  For review of initial/current patient goals, please see plan of care.  Estimated Length of Stay:  3-5 days  Reasons for Continued Hospitalization:  Anxiety Depression Medication stabilization  New Problems/Goals identified:    Discharge Plan or Barriers:   Home with outpatient follow up   Additional Comments:  Patient admitted with SI.  He currently denies SI/HI and rates depression at six and anxiety at nine.    Patient to be placed on a detox protocol.    Attendees:  Patient:  Danny Campbell 05/20/2012 9:58 AM   Signature: Patrick North, MD 05/20/2012 9:58 AM  Signature:Tina Arlana Pouch, RN 05/20/2012 9:58 AM  Signature: Harold Barban, RN 05/20/2012 9:58 AM  Signature: 05/20/2012 9:58 AM  Signature:   05/20/2012 9:58 AM  Signature:  Juline Patch, LCSW 05/20/2012 9:58 AM  Signature:  05/20/2012 9:58 AM  Signature:  05/20/2012 9:58 AM  Signature: Fransisca Kaufmann, [facility] 05/20/2012 9:58 AM  Signature:    Signature:    Signature:      Scribe for Treatment Team:   Juline Patch,  05/20/2012 9:58 AM

## 2012-05-20 NOTE — [facility] Counselor (Signed)
Adult Comprehensive Assessment  Patient ID: Danny Campbell, male   DOB: 01-26-1981, 32 y.o.   MRN: [id number]  Information Source: Information source: Patient  Current Stressors:  Educational / Learning stressors: None- Patient is not a Consulting civil engineer.  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  Patient reports he ws n the hospital for a boold clot for three weeks Social relationships: None Substance abuse:  Reports he has had had alcohol for a month.  Denies any other drug use  Living/Environment/Situation:  Living Arrangements: Spouse/significant other Living conditions (as described by patient or guardian): Comfortable How long has patient lived in current situation?: two years What is atmosphere in current home: Comfortable;Loving;Supportive  Family History:  Marital status: Divorced Divorced, when?: six years What types of issues is patient dealing with in the relationship?: Reports being in a good relationship Does patient have children?: Yes How many children?: 2 How is patient's relationship with their children?: Very good relationship with three and nine years old children  Childhood History:  By whom was/is the patient raised?: Both parents Additional childhood history information: [place] Description of patient's relationship with caregiver when they were a child: Very good Patient's description of current relationship with people who raised him/her: Good with mother - Father is deceased Does patient have siblings?: Yes Number of Siblings: 1 Description of patient's current relationship with siblings: Great Did patient suffer any verbal/emotional/physical/sexual abuse as a child?: No Has patient ever  been sexually abused/assaulted/raped as an adolescent or adult?: No Was the patient ever a victim of a crime or a disaster?: No Witnessed domestic violence?: No Has patient been effected by domestic violence as an adult?: No  Education:  Currently a Consulting civil engineer?: No Learning disability?: No  Employment/Work Situation:   Employment situation: Employed Where is patient currently employed?: Self employed as a Brewing technologist How long has patient been employed?: two years Patient's job has been impacted by current illness: No What is the longest time patient has a held a job?: Seven Years 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 Has patient ever been in the [employer]?: No Has patient ever served in combat?: No  Financial Resources:   Financial resources: Income from employment Does patient have a representative payee or guardian?: No  Alcohol/Substance Abuse:   What has been your use of drugs/alcohol within the last 12 months?: Patient reports he last drank a six pack of beer a month ago Alcohol/Substance Abuse Treatment Hx: Past Tx, Inpatient If yes, describe treatment: [facility] 2011 Has alcohol/substance abuse ever caused legal problems?: No  Social Support System:   Conservation officer, nature Support System: Fair Museum/gallery exhibitions officer System: Patient is active with NA Type of faith/religion: [employer]ri[employer] Kn[employer] How does patient's faith help to cope with current illness?: Prays - knows God will help him  Leisure/Recreation:   Leisure and Hobbies: Spending time with kids at the park and playing basketball  Strengths/Needs:   What things does the patient do well?: Makes people augh In what areas does patient struggle / problems for patient: Lonliness  Discharge Plan:   Does patient have access to transportation?: Yes Will patient be returning to same living situation after discharge?: Yes Currently receiving community mental health services: No If no, would patient like  referral for services when discharged?: Yes (What county?) [facility]) Does patient have financial barriers related to discharge medications?: No  Summary/Recommendations:  Danny Campbell is a 32 year old Caucasian male admitted with Major Depression Disorder.  He will Patient will benefit from crisis stabilization, evaluation for medication management, psycho education groups for coping skills development, group therapy and assistance with discharge planning.     Danny Campbell, Danny Campbell. 05/20/2012
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
## 2012-05-20 NOTE — Progress Notes (Signed)
Recreation Therapy Notes  Date: 03.26.2014 Time: 3:00pm Location: [facility]      Group Topic/Focus: Goal Setting  Participation Level: Did not attend  [employer], LRT/CTRS  Jearl Klinefelter 05/20/2012 3:55 PM

## 2012-05-20 NOTE — Progress Notes (Signed)
[facility] LCSW Aftercare Discharge Planning Group Note  05/20/2012 9:59 AM  Participation Quality:   Did not attend group.  Wynn Banker 05/20/2012, 9:59 AM

## 2012-05-20 NOTE — Progress Notes (Signed)
D: Patient in bed all evening; did not attend group. He seemed not to be investing in treatment, demanding and said staff nor doing anything to help him with his pain. Patient stated that he is leaving tomorrow; "you not giving me the medications I need, you trying to detox me, I'm here for depression not detox". He appearedvery angry and irritable. He refused his HS Trazodone, complaint of generalized pain. Denied SI/HI and denied hallucinations. Patient also insisted that they should discharge him tomorrow; " another person can use the bed, I don't need to be holding the bed, another person can use it.  A: Writer encouraged and sup[ported patient, offered Robaxin for his pain.  R: Patient received his medications without difficulty. Q 15 minute check continues as ordered to maintain safety.

## 2012-05-20 NOTE — Progress Notes (Signed)
  D) Patient irritable upon my assessment. Patient encouraged to attend discharge planning group, patient states "this is bullshit!" Patient states "I just need something to wean me off the pain meds, I was in the hospital for 3 weeks and now I am not getting any pain meds." Patient refused to complete Patient Self Inventory. Patient denies SI/HI, denies A/V hallucinations.   A) Patient offered support and encouragement, patient encouraged to discuss feelings/concerns with staff. Patient demonstrates understanding. Patient monitored Q15 minutes for safety. Patient met with MD  to discuss today's goals and plan of care.  R) Patient isolates to room at times. Refuses to attend groups in day room and meals in dining room. Patient taking medications as ordered. Will continue to monitor.

## 2012-05-20 NOTE — Progress Notes (Signed)
ANTICOAGULATION CONSULT NOTE - Initial Consult  Pharmacy Consult for Coumadin Indication: DVT  Allergies  Allergen Reactions  . Benadryl (Diphenhydramine) Other (See Comments)    Jerking and twitching  . Penicillins Other (See Comments)    childhood    Patient Measurements: Height: 6\' 1"  (185.4 cm) Weight: 184 lb (83.462 kg) IBW/kg (Calculated) : 79.9   Vital Signs: Temp: 98.4 F (36.9 C) (03/25 2152) Temp src: Oral (03/25 2152) BP: 145/90 mmHg (03/25 2154) Pulse Rate: 89 (03/25 2152)  Labs:  Recent Labs  05/19/12 1315 05/19/12 1350 05/20/12 0624  HGB 15.1  --   --   HCT 42.7  --   --   PLT 292  --   --   LABPROT  --  29.0* 27.1*  INR  --  2.92* 2.67*  CREATININE 0.98  --   --     Estimated Creatinine Clearance: 125.7 ml/min (by C-G formula based on Cr of 0.98).   Medical History: Past Medical History  Diagnosis Date  . Staph infection   . Anxiety   . Hepatitis C   . Substance abuse     Medications:  Prescriptions prior to admission  Medication Sig Dispense Refill  . acetaminophen (TYLENOL) 500 MG tablet Take 500 mg by mouth every 6 (six) hours as needed for pain.      Marland Kitchen alum & mag hydroxide-simeth (MAALOX/MYLANTA) 200-200-20 MG/5ML suspension Take 15 mLs by mouth every 6 (six) hours as needed for indigestion.      Marland Kitchen morphine (MS CONTIN) 15 MG 12 hr tablet Take 15 mg by mouth 2 (two) times daily.      Marland Kitchen warfarin (COUMADIN) 4 MG tablet Take 4 mg by mouth daily.        Assessment: INR currently at goal at 2.67.  No problems with anticoagulation therapy noted.  Recent DVT (note states 3 weeks ago).  Patient stated he took coumadin prior to going to ER on 05/19/12.  No coumadin noted as given in hospital on 05/19/12.  He takes in am at home.  Goal of Therapy:  INR 2-3    Plan:  Continue home dose of Coumadin for now.  Will get PT/INR on Friday am labs.  Spoke with patient regarding coumadin.     Charyl Dancer 05/20/2012,7:24 AM
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
## 2012-05-21 DIAGNOSIS — F4321 Adjustment disorder with depressed mood: Principal | ICD-10-CM

## 2012-05-21 MED ORDER — TRAZODONE HCL 50 MG PO TABS: 50.0000 mg | ORAL_TABLET | Freq: Every evening | ORAL | Status: DC | PRN

## 2012-05-21 MED ORDER — WARFARIN SODIUM 2 MG PO TABS
2.0000 mg | ORAL_TABLET | Freq: Once | ORAL | Status: DC
  Filled 2012-05-21: qty 1

## 2012-05-21 NOTE — Progress Notes (Signed)
The focus of this group is to help patients review their daily goal of treatment and discuss progress on daily workbooks. Patient remained in bed during group stating that he was not feeling well and his stomach was upset.

## 2012-05-21 NOTE — [facility] Suicide Risk Assessment (Signed)
Suicide Risk Assessment  Discharge Assessment     Demographic Factors:  Male, Caucasian and Low socioeconomic status  Mental Status Per Nursing Assessment::   On Admission:  NA  Current Mental Status by Physician: Patient alert and oriented to 4. Denies AH/VH/SI/HI.  Loss Factors: Decline in physical health  Historical Factors: Family history of mental illness or substance abuse and Impulsivity  Risk Reduction Factors:   Employed, Positive social support and Positive coping skills or problem solving skills  Continued Clinical Symptoms:  Depression:   Recent sense of peace/wellbeing  Cognitive Features That Contribute To Risk:  Cognitively intact   Suicide Risk:  Minimal: No identifiable suicidal ideation.  Patients presenting with no risk factors but with morbid ruminations; may be classified as minimal risk based on the severity of the depressive symptoms  Discharge Diagnoses:   AXIS I:  Adjustment Disorder with Depressed Mood AXIS II:  Deferred AXIS III:   Past Medical History  Diagnosis Date  . Staph infection   . Anxiety   . Hepatitis C   . Substance abuse    AXIS IV:  other psychosocial or environmental problems AXIS V:  61-70 mild symptoms  Plan Of Care/Follow-up recommendations:  Activity:  as tolerated Diet:  regular Follow up with outpatient appointments.  Is patient on multiple antipsychotic therapies at discharge:  No   Has Patient had three or more failed trials of antipsychotic monotherapy by history:  No  Recommended Plan for Multiple Antipsychotic Therapies: NA  Ladonte Verstraete 05/21/2012, 10:34 AM

## 2012-05-21 NOTE — Progress Notes (Signed)
[facility] LCSW Aftercare Discharge Planning Group Note  05/21/2012 4:36 PM  Participation Quality:  Appropriate and Attentive  Affect:  Appropriate  Cognitive:  Alert and Appropriate  Insight:  Engaged  Engagement in Group:  Engaged  Modes of Intervention:  Education, Exploration and Support  Summary of Progress/Problems:  Patient reported doing well and ready to discharge home today.  He stated he did not come to hospital to detox from medications and wants to leave.  He denied SI/HI and rated all symptoms at zero.  Patient stated he is willing to follow up with [facility] in [place].  Wynn Banker 05/21/2012, 4:36 PM

## 2012-05-21 NOTE — Progress Notes (Signed)
Pt discharged per MD orders; pt currently denies SI/HI and auditory/visual hallucinations; pt was given education by RN regarding follow-up appointments and medications and pt denied any questions or concerns about these instructions; pt was then escorted to search room to retrieve his belongings by RN before being discharged to hospital lobby. 

## 2012-05-21 NOTE — Progress Notes (Signed)
[facility] Adult Case Management Discharge Plan :  Will you be returning to the same living situation after discharge: Yes,  Patient returning to his home. At discharge, do you have transportation home?:Yes,  Patient to arrange transporation home. Do you have the ability to pay for your medications:No.  Patient assisted with indigent medications.  Release of information consent forms completed and in the chart;  Patient's signature needed at discharge.  Patient to Follow up at: Follow-up Information   Follow up with [facility] On 05/22/2012. (Friday, May 22, 2012 at 1:00  PM)    Contact information:   [address] [place], [place]   [address]  [phone]      Patient denies SI/HI:   Yes,  Patient not endorsing SI/HI or thoughts of self harm.    Safety Planning and Suicide Prevention discussed:  Yes,  Reviewed during after care group.  Wynn Banker 05/21/2012, 4:34 PM

## 2012-05-21 NOTE — Progress Notes (Signed)
[facility] INPATIENT:  Family/Significant Other Suicide Prevention Education  Suicide Prevention Education:  Patient Refusal for Family/Significant Other Suicide Prevention Education: The patient DOMNICK CHERVENAK has refused to provide written consent for family/significant other to be provided Family/Significant Other Suicide Prevention Education during admission and/or prior to discharge.  Physician notified.  Patient advised he did not want any contact made with family.  Wynn Banker 05/21/2012, 4:36 PM

## 2012-05-21 NOTE — Progress Notes (Signed)
ANTICOAGULATION CONSULT NOTE - Follow Up Consult  Pharmacy Consult for Coumadin Indication: DVT  Allergies  Allergen Reactions  . Benadryl (Diphenhydramine) Other (See Comments)    Jerking and twitching  . Penicillins Other (See Comments)    childhood    Patient Measurements: Height: 6\' 1"  (185.4 cm) Weight: 184 lb (83.462 kg) IBW/kg (Calculated) : 79.9  Vital Signs: Temp: 98.5 F (36.9 C) (03/26 2050) Temp src: Oral (03/26 2050) BP: 108/71 mmHg (03/26 2050) Pulse Rate: 85 (03/26 2050)  Labs:  Recent Labs  05/19/12 1315 05/19/12 1350 05/20/12 0624 05/21/12 0633  HGB 15.1  --   --   --   HCT 42.7  --   --   --   PLT 292  --   --   --   LABPROT  --  29.0* 27.1* 29.6*  INR  --  2.92* 2.67* 3.01*  CREATININE 0.98  --   --   --     Estimated Creatinine Clearance: 125.7 ml/min (by C-G formula based on Cr of 0.98).   Medications:  Scheduled:  . cloNIDine  0.1 mg Oral QID   Followed by  . [START ON 05/22/2012] cloNIDine  0.1 mg Oral BH-qamhs   Followed by  . [START ON 05/24/2012] cloNIDine  0.1 mg Oral QAC breakfast  . nicotine  14 mg Transdermal Q0600  . traZODone  50 mg Oral QHS,MR X 1  . warfarin  2 mg Oral ONCE-1800  . Warfarin - Pharmacist Dosing Inpatient   Does not apply q1800  . [DISCONTINUED] warfarin  4 mg Oral q1800  . [DISCONTINUED] warfarin  4 mg Oral Daily    Assessment: Patient's INR is 3.01 today.  Patient has been getting home dose of 4 mg daily.  Goal of Therapy:  INR 2-3    Plan:  Will give patient a reduced dose of Coumadin 2 mg today and recheck INR in AM.    Pamala Duffel L 05/21/2012,8:18 AM

## 2012-05-21 NOTE — Progress Notes (Signed)
The focus of this group is to help patients review their daily goal of treatment and discuss progress on daily workbooks.  In addition patients were asked to give an example of one good thing that happened to them during the day.  Patient stated that he finally realized that even after all that he has been through that it is still okay to be nice to people that are nice to you.  When asked if there was anything else he would like to share patient stated that that was all.  Patient appeared to be in a better mood today and even was smiling during group this evening.

## 2012-05-26 NOTE — Progress Notes (Signed)
Patient Discharge Instructions:  After Visit Summary (AVS):   Faxed to:  05/26/12 Discharge Summary Note:   Faxed to:  05/26/12 Psychiatric Admission Assessment Note:   Faxed to:  05/26/12 Suicide Risk Assessment - Discharge Assessment:   Faxed to:  05/26/12 Faxed/Sent to the Next Level Care provider:  05/26/12 Faxed to [facility] @ [phone]  Jerelene Redden, 05/26/2012, 4:01 PM

## 2012-05-28 NOTE — Discharge Summary (Signed)
Physician Discharge Summary Note  Patient:  Danny Campbell is an 32 y.o., male MRN:  [id number] DOB:  04-12-1980 Patient phone:  [phone] (home)  Patient address:   [address] [place] [address]   Date of Admission:  05/19/2012 Date of Discharge: 05/21/2012  Discharge Diagnoses: Active Problems:   * No active hospital problems. *  Axis Diagnosis: AXIS I: Adjustment Disorder with Depressed Mood  AXIS II: Deferred  AXIS III:  Past Medical History   Diagnosis  Date   .  Staph infection    .  Anxiety    .  Hepatitis C    .  Substance abuse     AXIS IV: other psychosocial or environmental problems  AXIS V: 61-70 mild symptoms    Level of Care:  Inpatient Hospitalization.  Hospital Course:   Patient presented to the ED reporting increased depression over the last 2 weeks. He states that he was just discharged from [facility] where he he had been admitted for the past 3 weeks for cellulitis of his right hand. He states he had I&D for the cellulitis and had a pic-line placement as well. He was not discharged on any pain medications. He reports that he has been depressed for the past 2 weeks while in the hospital. He recently learned that he had Hepatitis C while in the hospital and states things were not going well at home with his children.  During admission, patient endorsed mood symptoms but was not interested in starting medication. He was mostly isolated to his room and resting. Through groups and interactions with clinicians he realized his mood symptoms were situational and he agreed to see a therapist after discharge.The patient attended treatment team meeting on the morning of discharge and met with treatment team members. The patient's symptoms, treatment plan and response to treatment was discussed. The patient endorsed that their symptoms have improved. The patient also stated that they felt stable for discharge.  They reported that from this hospital stay they  had learned many coping skills.  In other to maintain their psychiatric stability, they will continue psychiatric care on an outpatient basis. They will follow-up as outlined below.  In addition they were instructed  to take all your medications as prescribed by their mental healthcare provider and to report any adverse effects and or reactions from your medicines to their outpatient provider promptly.  The patient is also instructed and cautioned to not engage in alcohol and or illegal drug use while on prescription medicines.  In the event of worsening symptoms the patient is instructed to call the crisis hotline, 911 and or go to the nearest ED for appropriate evaluation and treatment of symptoms.   Also while a patient in this hospital, the patient received medication management . They were ordered and received as outlined below:    Medication List    STOP taking these medications       acetaminophen 500 MG tablet  Commonly known as:  TYLENOL     alum & mag hydroxide-simeth 200-200-20 MG/5ML suspension  Commonly known as:  MAALOX/MYLANTA     morphine 15 MG 12 hr tablet  Commonly known as:  MS CONTIN      TAKE these medications     Indication   warfarin 4 MG tablet  Commonly known as:  COUMADIN  Take 4 mg by mouth daily.        They were also enrolled in group counseling sessions and activities in which  they participated actively.       Follow-up Information   Follow up with [facility] On 05/22/2012. (Friday, May 22, 2012 at 1:00  PM)    Contact information:   [address] [place], [place]   [address]  [phone]      Upon discharge, patient adamantly denies suicidal, homicidal ideations, auditory, visual hallucinations and or delusional thinking. They left [facility] with all personal belongings via personal transportation in no apparent distress.  Consults:  Please see electronic medical record for details.  Significant Diagnostic Studies:  Please see  electronic medical record for details.  Discharge Vitals:   Blood pressure 94/58, pulse 86, temperature 98 F (36.7 C), temperature source Oral, resp. rate 18, height 6\' 1"  (1.854 m), weight 83.462 kg (184 lb)..  Mental Status Exam: See Mental Status Examination and Suicide Risk Assessment completed by Attending Physician prior to discharge.  Discharge destination:  Home  Is patient on multiple antipsychotic therapies at discharge:  No  Has Patient had three or more failed trials of antipsychotic monotherapy by history: N/A Recommended Plan for Multiple Antipsychotic Therapies: N/A Discharge Orders   Future Orders Complete By Expires     Diet - low sodium heart healthy  As directed     Increase activity slowly  As directed         Medication List    STOP taking these medications       acetaminophen 500 MG tablet  Commonly known as:  TYLENOL     alum & mag hydroxide-simeth 200-200-20 MG/5ML suspension  Commonly known as:  MAALOX/MYLANTA     morphine 15 MG 12 hr tablet  Commonly known as:  MS CONTIN      TAKE these medications     Indication   warfarin 4 MG tablet  Commonly known as:  COUMADIN  Take 4 mg by mouth daily.            Follow-up Information   Follow up with [facility] On 05/22/2012. (Friday, May 22, 2012 at 1:00  PM)    Contact information:   [address] [place], [place]   [address]  [phone]     Follow-up recommendations:   Activities: Resume typical activities Diet: Resume typical diet Other: Follow up with outpatient provider and report any side effects to out patient prescriber.  Comments:  Take all your medications as prescribed by your mental healthcare provider. Report any adverse effects and or reactions from your medicines to your outpatient provider promptly. Patient is instructed and cautioned to not engage in alcohol and or illegal drug use while on prescription medicines. In the event of worsening symptoms,  patient is instructed to call the crisis hotline, 911 and or go to the nearest ED for appropriate evaluation and treatment of symptoms. Follow-up with your primary care provider for your other medical issues, concerns and or health care needs.  SignedPatrick North 05/28/2012 11:18 AM

## 2019-12-14 ENCOUNTER — Telehealth (HOSPITAL_COMMUNITY): Payer: Self-pay

## 2019-12-14 ENCOUNTER — Other Ambulatory Visit: Payer: Self-pay | Admitting: Nurse Practitioner

## 2019-12-14 DIAGNOSIS — U071 COVID-19: Secondary | ICD-10-CM

## 2019-12-14 NOTE — Telephone Encounter (Signed)
Called to Discuss with patient about Covid symptoms and the use of the monoclonal antibody infusion for those with mild to moderate Covid symptoms and at a high risk of hospitalization.     Pt appears to qualify for this infusion due to co-morbid conditions and/or a member of an at-risk group in accordance with the FDA Emergency Use Authorization.    Pt stated his symptoms started on 12/13/19 and tested positive on 12/14/19 at [facility] on [address]. He states he has a fever, cough, fatigue. His qualifying risk factor is current smoker. Patient is interested in the infusion and RN informed him that an APP will call him to set up an appointment.

## 2019-12-14 NOTE — Progress Notes (Signed)
I connected by phone with Danny Campbell on 12/14/2019 at 7:10 PM to discuss the potential use of a new treatment for mild to moderate COVID-19 viral infection in non-hospitalized patients.  This patient is a 39 y.o. male that meets the FDA criteria for Emergency Use Authorization of COVID monoclonal antibody casirivimab/imdevimab or bamlanivimab/eteseviamb.  Has a (+) direct SARS-CoV-2 viral test result  Has mild or moderate COVID-19   Is NOT hospitalized due to COVID-19  Is within 10 days of symptom onset  Has at least one of the high risk factor(s) for progression to severe COVID-19 and/or hospitalization as defined in EUA.  Specific high risk criteria : BMI > 25   I have spoken and communicated the following to the patient or parent/caregiver regarding COVID monoclonal antibody treatment:  1. FDA has authorized the emergency use for the treatment of mild to moderate COVID-19 in adults and pediatric patients with positive results of direct SARS-CoV-2 viral testing who are 49 years of age and older weighing at least 40 kg, and who are at high risk for progressing to severe COVID-19 and/or hospitalization.  2. The significant known and potential risks and benefits of COVID monoclonal antibody, and the extent to which such potential risks and benefits are unknown.  3. Information on available alternative treatments and the risks and benefits of those alternatives, including clinical trials.  4. Patients treated with COVID monoclonal antibody should continue to self-isolate and use infection control measures (e.g., wear mask, isolate, social distance, avoid sharing personal items, clean and disinfect "high touch" surfaces, and frequent handwashing) according to CDC guidelines.   5. The patient or parent/caregiver has the option to accept or refuse COVID monoclonal antibody treatment.  After reviewing this information with the patient, the patient has agreed to receive one of the  available covid 19 monoclonal antibodies and will be provided an appropriate fact sheet prior to infusion. Mayra Reel, NP 12/14/2019 7:10 PM

## 2019-12-15 ENCOUNTER — Ambulatory Visit (HOSPITAL_COMMUNITY)
Admission: RE | Admit: 2019-12-15 | Discharge: 2019-12-15 | Disposition: A | Discharge status: Home / Self Care | Payer: 59 | Source: Ambulatory Visit | Attending: Pulmonary Disease | Admitting: Pulmonary Disease

## 2019-12-15 DIAGNOSIS — U071 COVID-19: Secondary | ICD-10-CM | POA: Diagnosis not present

## 2019-12-15 MED ORDER — SODIUM CHLORIDE 0.9 % IV SOLN: INTRAVENOUS | Status: DC | PRN

## 2019-12-15 MED ORDER — FAMOTIDINE IN NACL 20-0.9 MG/50ML-% IV SOLN: 20.0000 mg | Freq: Once | INTRAVENOUS | Status: DC | PRN

## 2019-12-15 MED ORDER — SODIUM CHLORIDE 0.9 % IV SOLN: Freq: Once | INTRAVENOUS | Status: AC

## 2019-12-15 MED ORDER — DIPHENHYDRAMINE HCL 50 MG/ML IJ SOLN: 50.0000 mg | Freq: Once | INTRAMUSCULAR | Status: DC | PRN

## 2019-12-15 MED ORDER — METHYLPREDNISOLONE SODIUM SUCC 125 MG IJ SOLR: 125.0000 mg | Freq: Once | INTRAMUSCULAR | Status: DC | PRN

## 2019-12-15 MED ORDER — EPINEPHRINE 0.3 MG/0.3ML IJ SOAJ: 0.3000 mg | Freq: Once | INTRAMUSCULAR | Status: DC | PRN

## 2019-12-15 MED ORDER — ALBUTEROL SULFATE HFA 108 (90 BASE) MCG/ACT IN AERS: 2.0000 | INHALATION_SPRAY | Freq: Once | RESPIRATORY_TRACT | Status: DC | PRN

## 2019-12-15 NOTE — Progress Notes (Signed)
  Diagnosis: COVID-19  Physician: Dr. Wright  Procedure: Covid Infusion Clinic Med: bamlanivimab\etesevimab infusion - Provided patient with bamlanimivab\etesevimab fact sheet for patients, parents and caregivers prior to infusion.  Complications: No immediate complications noted.  Discharge: Discharged home   Jaidy Cottam J Jaray Boliver 12/15/2019  

## 2019-12-15 NOTE — Discharge Instructions (Signed)

## 2019-12-23 ENCOUNTER — Other Ambulatory Visit (HOSPITAL_COMMUNITY): Payer: Self-pay

## 2021-12-12 ENCOUNTER — Ambulatory Visit (HOSPITAL_BASED_OUTPATIENT_CLINIC_OR_DEPARTMENT_OTHER): Payer: 59

## 2021-12-12 ENCOUNTER — Other Ambulatory Visit (HOSPITAL_BASED_OUTPATIENT_CLINIC_OR_DEPARTMENT_OTHER): Payer: Self-pay | Admitting: Internal Medicine

## 2021-12-12 DIAGNOSIS — F1011 Alcohol abuse, in remission: Secondary | ICD-10-CM

## 2021-12-12 DIAGNOSIS — R109 Unspecified abdominal pain: Secondary | ICD-10-CM

## 2021-12-12 DIAGNOSIS — R1084 Generalized abdominal pain: Secondary | ICD-10-CM

## 2022-03-28 DIAGNOSIS — F1420 Cocaine dependence, uncomplicated: Secondary | ICD-10-CM | POA: Diagnosis not present

## 2022-03-28 DIAGNOSIS — F1220 Cannabis dependence, uncomplicated: Secondary | ICD-10-CM | POA: Diagnosis not present

## 2022-03-28 DIAGNOSIS — F1020 Alcohol dependence, uncomplicated: Secondary | ICD-10-CM | POA: Diagnosis not present

## 2022-03-28 DIAGNOSIS — F1120 Opioid dependence, uncomplicated: Secondary | ICD-10-CM | POA: Diagnosis not present

## 2022-03-29 DIAGNOSIS — F1220 Cannabis dependence, uncomplicated: Secondary | ICD-10-CM | POA: Diagnosis not present

## 2022-03-29 DIAGNOSIS — F1020 Alcohol dependence, uncomplicated: Secondary | ICD-10-CM | POA: Diagnosis not present

## 2022-03-29 DIAGNOSIS — F1420 Cocaine dependence, uncomplicated: Secondary | ICD-10-CM | POA: Diagnosis not present

## 2022-03-29 DIAGNOSIS — F1120 Opioid dependence, uncomplicated: Secondary | ICD-10-CM | POA: Diagnosis not present

## 2022-03-30 DIAGNOSIS — F1020 Alcohol dependence, uncomplicated: Secondary | ICD-10-CM | POA: Diagnosis not present

## 2022-03-30 DIAGNOSIS — F1420 Cocaine dependence, uncomplicated: Secondary | ICD-10-CM | POA: Diagnosis not present

## 2022-03-30 DIAGNOSIS — F1120 Opioid dependence, uncomplicated: Secondary | ICD-10-CM | POA: Diagnosis not present

## 2022-03-30 DIAGNOSIS — F1220 Cannabis dependence, uncomplicated: Secondary | ICD-10-CM | POA: Diagnosis not present

## 2022-03-31 DIAGNOSIS — F1020 Alcohol dependence, uncomplicated: Secondary | ICD-10-CM | POA: Diagnosis not present

## 2022-03-31 DIAGNOSIS — F1220 Cannabis dependence, uncomplicated: Secondary | ICD-10-CM | POA: Diagnosis not present

## 2022-03-31 DIAGNOSIS — F1420 Cocaine dependence, uncomplicated: Secondary | ICD-10-CM | POA: Diagnosis not present

## 2022-03-31 DIAGNOSIS — F1120 Opioid dependence, uncomplicated: Secondary | ICD-10-CM | POA: Diagnosis not present

## 2022-04-01 DIAGNOSIS — F1420 Cocaine dependence, uncomplicated: Secondary | ICD-10-CM | POA: Diagnosis not present

## 2022-04-01 DIAGNOSIS — F1020 Alcohol dependence, uncomplicated: Secondary | ICD-10-CM | POA: Diagnosis not present

## 2022-04-01 DIAGNOSIS — F1120 Opioid dependence, uncomplicated: Secondary | ICD-10-CM | POA: Diagnosis not present

## 2022-04-01 DIAGNOSIS — F1220 Cannabis dependence, uncomplicated: Secondary | ICD-10-CM | POA: Diagnosis not present

## 2022-04-02 DIAGNOSIS — F1420 Cocaine dependence, uncomplicated: Secondary | ICD-10-CM | POA: Diagnosis not present

## 2022-04-02 DIAGNOSIS — F1120 Opioid dependence, uncomplicated: Secondary | ICD-10-CM | POA: Diagnosis not present

## 2022-04-02 DIAGNOSIS — F1220 Cannabis dependence, uncomplicated: Secondary | ICD-10-CM | POA: Diagnosis not present

## 2022-04-02 DIAGNOSIS — F1020 Alcohol dependence, uncomplicated: Secondary | ICD-10-CM | POA: Diagnosis not present

## 2022-04-03 DIAGNOSIS — F1120 Opioid dependence, uncomplicated: Secondary | ICD-10-CM | POA: Diagnosis not present

## 2022-04-03 DIAGNOSIS — F1020 Alcohol dependence, uncomplicated: Secondary | ICD-10-CM | POA: Diagnosis not present

## 2022-04-03 DIAGNOSIS — F1420 Cocaine dependence, uncomplicated: Secondary | ICD-10-CM | POA: Diagnosis not present

## 2022-04-03 DIAGNOSIS — F1220 Cannabis dependence, uncomplicated: Secondary | ICD-10-CM | POA: Diagnosis not present

## 2022-04-04 DIAGNOSIS — F1020 Alcohol dependence, uncomplicated: Secondary | ICD-10-CM | POA: Diagnosis not present

## 2022-04-04 DIAGNOSIS — F1420 Cocaine dependence, uncomplicated: Secondary | ICD-10-CM | POA: Diagnosis not present

## 2022-04-04 DIAGNOSIS — F1120 Opioid dependence, uncomplicated: Secondary | ICD-10-CM | POA: Diagnosis not present

## 2022-04-04 DIAGNOSIS — F1220 Cannabis dependence, uncomplicated: Secondary | ICD-10-CM | POA: Diagnosis not present

## 2022-04-05 DIAGNOSIS — F1420 Cocaine dependence, uncomplicated: Secondary | ICD-10-CM | POA: Diagnosis not present

## 2022-04-05 DIAGNOSIS — F1120 Opioid dependence, uncomplicated: Secondary | ICD-10-CM | POA: Diagnosis not present

## 2022-04-05 DIAGNOSIS — F1220 Cannabis dependence, uncomplicated: Secondary | ICD-10-CM | POA: Diagnosis not present

## 2022-04-05 DIAGNOSIS — F1020 Alcohol dependence, uncomplicated: Secondary | ICD-10-CM | POA: Diagnosis not present

## 2022-04-06 DIAGNOSIS — F1420 Cocaine dependence, uncomplicated: Secondary | ICD-10-CM | POA: Diagnosis not present

## 2022-04-06 DIAGNOSIS — F1220 Cannabis dependence, uncomplicated: Secondary | ICD-10-CM | POA: Diagnosis not present

## 2022-04-06 DIAGNOSIS — F1120 Opioid dependence, uncomplicated: Secondary | ICD-10-CM | POA: Diagnosis not present

## 2022-04-06 DIAGNOSIS — F1020 Alcohol dependence, uncomplicated: Secondary | ICD-10-CM | POA: Diagnosis not present

## 2022-04-07 DIAGNOSIS — F1420 Cocaine dependence, uncomplicated: Secondary | ICD-10-CM | POA: Diagnosis not present

## 2022-04-07 DIAGNOSIS — F1020 Alcohol dependence, uncomplicated: Secondary | ICD-10-CM | POA: Diagnosis not present

## 2022-04-07 DIAGNOSIS — F1220 Cannabis dependence, uncomplicated: Secondary | ICD-10-CM | POA: Diagnosis not present

## 2022-04-07 DIAGNOSIS — F1120 Opioid dependence, uncomplicated: Secondary | ICD-10-CM | POA: Diagnosis not present

## 2022-04-08 DIAGNOSIS — F1220 Cannabis dependence, uncomplicated: Secondary | ICD-10-CM | POA: Diagnosis not present

## 2022-04-08 DIAGNOSIS — F1120 Opioid dependence, uncomplicated: Secondary | ICD-10-CM | POA: Diagnosis not present

## 2022-04-08 DIAGNOSIS — F1420 Cocaine dependence, uncomplicated: Secondary | ICD-10-CM | POA: Diagnosis not present

## 2022-04-08 DIAGNOSIS — F1020 Alcohol dependence, uncomplicated: Secondary | ICD-10-CM | POA: Diagnosis not present

## 2022-04-09 DIAGNOSIS — F1420 Cocaine dependence, uncomplicated: Secondary | ICD-10-CM | POA: Diagnosis not present

## 2022-04-09 DIAGNOSIS — F1120 Opioid dependence, uncomplicated: Secondary | ICD-10-CM | POA: Diagnosis not present

## 2022-04-09 DIAGNOSIS — F1220 Cannabis dependence, uncomplicated: Secondary | ICD-10-CM | POA: Diagnosis not present

## 2022-04-09 DIAGNOSIS — F1020 Alcohol dependence, uncomplicated: Secondary | ICD-10-CM | POA: Diagnosis not present

## 2022-04-10 DIAGNOSIS — F1120 Opioid dependence, uncomplicated: Secondary | ICD-10-CM | POA: Diagnosis not present

## 2022-04-10 DIAGNOSIS — F1020 Alcohol dependence, uncomplicated: Secondary | ICD-10-CM | POA: Diagnosis not present

## 2022-04-10 DIAGNOSIS — F1220 Cannabis dependence, uncomplicated: Secondary | ICD-10-CM | POA: Diagnosis not present

## 2022-04-10 DIAGNOSIS — F1420 Cocaine dependence, uncomplicated: Secondary | ICD-10-CM | POA: Diagnosis not present

## 2022-04-11 DIAGNOSIS — F1020 Alcohol dependence, uncomplicated: Secondary | ICD-10-CM | POA: Diagnosis not present

## 2022-04-11 DIAGNOSIS — F1420 Cocaine dependence, uncomplicated: Secondary | ICD-10-CM | POA: Diagnosis not present

## 2022-04-11 DIAGNOSIS — F1220 Cannabis dependence, uncomplicated: Secondary | ICD-10-CM | POA: Diagnosis not present

## 2022-04-11 DIAGNOSIS — F1120 Opioid dependence, uncomplicated: Secondary | ICD-10-CM | POA: Diagnosis not present

## 2022-04-12 DIAGNOSIS — F1220 Cannabis dependence, uncomplicated: Secondary | ICD-10-CM | POA: Diagnosis not present

## 2022-04-12 DIAGNOSIS — F1120 Opioid dependence, uncomplicated: Secondary | ICD-10-CM | POA: Diagnosis not present

## 2022-04-12 DIAGNOSIS — F1420 Cocaine dependence, uncomplicated: Secondary | ICD-10-CM | POA: Diagnosis not present

## 2022-04-12 DIAGNOSIS — F1020 Alcohol dependence, uncomplicated: Secondary | ICD-10-CM | POA: Diagnosis not present

## 2022-04-13 DIAGNOSIS — F1020 Alcohol dependence, uncomplicated: Secondary | ICD-10-CM | POA: Diagnosis not present

## 2022-04-13 DIAGNOSIS — F1220 Cannabis dependence, uncomplicated: Secondary | ICD-10-CM | POA: Diagnosis not present

## 2022-04-13 DIAGNOSIS — F1420 Cocaine dependence, uncomplicated: Secondary | ICD-10-CM | POA: Diagnosis not present

## 2022-04-13 DIAGNOSIS — F1120 Opioid dependence, uncomplicated: Secondary | ICD-10-CM | POA: Diagnosis not present

## 2022-04-14 DIAGNOSIS — F1120 Opioid dependence, uncomplicated: Secondary | ICD-10-CM | POA: Diagnosis not present

## 2022-04-14 DIAGNOSIS — F1220 Cannabis dependence, uncomplicated: Secondary | ICD-10-CM | POA: Diagnosis not present

## 2022-04-14 DIAGNOSIS — F1020 Alcohol dependence, uncomplicated: Secondary | ICD-10-CM | POA: Diagnosis not present

## 2022-04-14 DIAGNOSIS — F1420 Cocaine dependence, uncomplicated: Secondary | ICD-10-CM | POA: Diagnosis not present

## 2022-04-15 DIAGNOSIS — F1120 Opioid dependence, uncomplicated: Secondary | ICD-10-CM | POA: Diagnosis not present

## 2022-04-15 DIAGNOSIS — F1420 Cocaine dependence, uncomplicated: Secondary | ICD-10-CM | POA: Diagnosis not present

## 2022-04-15 DIAGNOSIS — F1220 Cannabis dependence, uncomplicated: Secondary | ICD-10-CM | POA: Diagnosis not present

## 2022-04-15 DIAGNOSIS — F1020 Alcohol dependence, uncomplicated: Secondary | ICD-10-CM | POA: Diagnosis not present

## 2022-04-16 DIAGNOSIS — F1220 Cannabis dependence, uncomplicated: Secondary | ICD-10-CM | POA: Diagnosis not present

## 2022-04-16 DIAGNOSIS — F1420 Cocaine dependence, uncomplicated: Secondary | ICD-10-CM | POA: Diagnosis not present

## 2022-04-16 DIAGNOSIS — F1120 Opioid dependence, uncomplicated: Secondary | ICD-10-CM | POA: Diagnosis not present

## 2022-04-16 DIAGNOSIS — F1020 Alcohol dependence, uncomplicated: Secondary | ICD-10-CM | POA: Diagnosis not present

## 2022-04-17 DIAGNOSIS — F1420 Cocaine dependence, uncomplicated: Secondary | ICD-10-CM | POA: Diagnosis not present

## 2022-04-17 DIAGNOSIS — F1020 Alcohol dependence, uncomplicated: Secondary | ICD-10-CM | POA: Diagnosis not present

## 2022-04-17 DIAGNOSIS — F1220 Cannabis dependence, uncomplicated: Secondary | ICD-10-CM | POA: Diagnosis not present

## 2022-04-17 DIAGNOSIS — F1120 Opioid dependence, uncomplicated: Secondary | ICD-10-CM | POA: Diagnosis not present

## 2022-04-18 DIAGNOSIS — F1220 Cannabis dependence, uncomplicated: Secondary | ICD-10-CM | POA: Diagnosis not present

## 2022-04-18 DIAGNOSIS — F1120 Opioid dependence, uncomplicated: Secondary | ICD-10-CM | POA: Diagnosis not present

## 2022-04-18 DIAGNOSIS — F1420 Cocaine dependence, uncomplicated: Secondary | ICD-10-CM | POA: Diagnosis not present

## 2022-04-18 DIAGNOSIS — F1020 Alcohol dependence, uncomplicated: Secondary | ICD-10-CM | POA: Diagnosis not present

## 2022-04-19 DIAGNOSIS — F1020 Alcohol dependence, uncomplicated: Secondary | ICD-10-CM | POA: Diagnosis not present

## 2022-04-19 DIAGNOSIS — F1120 Opioid dependence, uncomplicated: Secondary | ICD-10-CM | POA: Diagnosis not present

## 2022-04-19 DIAGNOSIS — F1420 Cocaine dependence, uncomplicated: Secondary | ICD-10-CM | POA: Diagnosis not present

## 2022-04-19 DIAGNOSIS — F1220 Cannabis dependence, uncomplicated: Secondary | ICD-10-CM | POA: Diagnosis not present

## 2022-04-20 DIAGNOSIS — F1120 Opioid dependence, uncomplicated: Secondary | ICD-10-CM | POA: Diagnosis not present

## 2022-04-20 DIAGNOSIS — F1420 Cocaine dependence, uncomplicated: Secondary | ICD-10-CM | POA: Diagnosis not present

## 2022-04-20 DIAGNOSIS — F1020 Alcohol dependence, uncomplicated: Secondary | ICD-10-CM | POA: Diagnosis not present

## 2022-04-20 DIAGNOSIS — F1220 Cannabis dependence, uncomplicated: Secondary | ICD-10-CM | POA: Diagnosis not present

## 2022-04-21 DIAGNOSIS — F1020 Alcohol dependence, uncomplicated: Secondary | ICD-10-CM | POA: Diagnosis not present

## 2022-04-21 DIAGNOSIS — F1420 Cocaine dependence, uncomplicated: Secondary | ICD-10-CM | POA: Diagnosis not present

## 2022-04-21 DIAGNOSIS — F1120 Opioid dependence, uncomplicated: Secondary | ICD-10-CM | POA: Diagnosis not present

## 2022-04-21 DIAGNOSIS — F1220 Cannabis dependence, uncomplicated: Secondary | ICD-10-CM | POA: Diagnosis not present

## 2022-04-22 DIAGNOSIS — F1020 Alcohol dependence, uncomplicated: Secondary | ICD-10-CM | POA: Diagnosis not present

## 2022-04-22 DIAGNOSIS — F1420 Cocaine dependence, uncomplicated: Secondary | ICD-10-CM | POA: Diagnosis not present

## 2022-04-22 DIAGNOSIS — F1120 Opioid dependence, uncomplicated: Secondary | ICD-10-CM | POA: Diagnosis not present

## 2022-04-22 DIAGNOSIS — F1220 Cannabis dependence, uncomplicated: Secondary | ICD-10-CM | POA: Diagnosis not present

## 2022-04-23 DIAGNOSIS — F1120 Opioid dependence, uncomplicated: Secondary | ICD-10-CM | POA: Diagnosis not present

## 2022-04-23 DIAGNOSIS — F1220 Cannabis dependence, uncomplicated: Secondary | ICD-10-CM | POA: Diagnosis not present

## 2022-04-23 DIAGNOSIS — F1420 Cocaine dependence, uncomplicated: Secondary | ICD-10-CM | POA: Diagnosis not present

## 2022-04-23 DIAGNOSIS — F1020 Alcohol dependence, uncomplicated: Secondary | ICD-10-CM | POA: Diagnosis not present

## 2022-04-24 DIAGNOSIS — F1420 Cocaine dependence, uncomplicated: Secondary | ICD-10-CM | POA: Diagnosis not present

## 2022-04-24 DIAGNOSIS — F1020 Alcohol dependence, uncomplicated: Secondary | ICD-10-CM | POA: Diagnosis not present

## 2022-04-24 DIAGNOSIS — F1220 Cannabis dependence, uncomplicated: Secondary | ICD-10-CM | POA: Diagnosis not present

## 2022-04-24 DIAGNOSIS — F1120 Opioid dependence, uncomplicated: Secondary | ICD-10-CM | POA: Diagnosis not present

## 2022-04-25 DIAGNOSIS — F1020 Alcohol dependence, uncomplicated: Secondary | ICD-10-CM | POA: Diagnosis not present

## 2022-04-25 DIAGNOSIS — F1120 Opioid dependence, uncomplicated: Secondary | ICD-10-CM | POA: Diagnosis not present

## 2022-04-25 DIAGNOSIS — F1420 Cocaine dependence, uncomplicated: Secondary | ICD-10-CM | POA: Diagnosis not present

## 2022-04-25 DIAGNOSIS — F1220 Cannabis dependence, uncomplicated: Secondary | ICD-10-CM | POA: Diagnosis not present

## 2022-04-26 DIAGNOSIS — F1220 Cannabis dependence, uncomplicated: Secondary | ICD-10-CM | POA: Diagnosis not present

## 2022-04-26 DIAGNOSIS — F1120 Opioid dependence, uncomplicated: Secondary | ICD-10-CM | POA: Diagnosis not present

## 2022-04-26 DIAGNOSIS — F1420 Cocaine dependence, uncomplicated: Secondary | ICD-10-CM | POA: Diagnosis not present

## 2022-04-26 DIAGNOSIS — F1020 Alcohol dependence, uncomplicated: Secondary | ICD-10-CM | POA: Diagnosis not present

## 2022-04-27 DIAGNOSIS — F1420 Cocaine dependence, uncomplicated: Secondary | ICD-10-CM | POA: Diagnosis not present

## 2022-04-27 DIAGNOSIS — F1220 Cannabis dependence, uncomplicated: Secondary | ICD-10-CM | POA: Diagnosis not present

## 2022-04-27 DIAGNOSIS — F1120 Opioid dependence, uncomplicated: Secondary | ICD-10-CM | POA: Diagnosis not present

## 2022-04-27 DIAGNOSIS — F1020 Alcohol dependence, uncomplicated: Secondary | ICD-10-CM | POA: Diagnosis not present

## 2022-04-28 DIAGNOSIS — F1220 Cannabis dependence, uncomplicated: Secondary | ICD-10-CM | POA: Diagnosis not present

## 2022-04-28 DIAGNOSIS — F1020 Alcohol dependence, uncomplicated: Secondary | ICD-10-CM | POA: Diagnosis not present

## 2022-04-28 DIAGNOSIS — F1120 Opioid dependence, uncomplicated: Secondary | ICD-10-CM | POA: Diagnosis not present

## 2022-04-28 DIAGNOSIS — F1420 Cocaine dependence, uncomplicated: Secondary | ICD-10-CM | POA: Diagnosis not present

## 2022-04-29 DIAGNOSIS — F1020 Alcohol dependence, uncomplicated: Secondary | ICD-10-CM | POA: Diagnosis not present

## 2022-04-29 DIAGNOSIS — F1120 Opioid dependence, uncomplicated: Secondary | ICD-10-CM | POA: Diagnosis not present

## 2022-04-29 DIAGNOSIS — F1420 Cocaine dependence, uncomplicated: Secondary | ICD-10-CM | POA: Diagnosis not present

## 2022-04-29 DIAGNOSIS — F1220 Cannabis dependence, uncomplicated: Secondary | ICD-10-CM | POA: Diagnosis not present

## 2022-04-30 DIAGNOSIS — F1420 Cocaine dependence, uncomplicated: Secondary | ICD-10-CM | POA: Diagnosis not present

## 2022-04-30 DIAGNOSIS — F1020 Alcohol dependence, uncomplicated: Secondary | ICD-10-CM | POA: Diagnosis not present

## 2022-04-30 DIAGNOSIS — F1220 Cannabis dependence, uncomplicated: Secondary | ICD-10-CM | POA: Diagnosis not present

## 2022-04-30 DIAGNOSIS — F1120 Opioid dependence, uncomplicated: Secondary | ICD-10-CM | POA: Diagnosis not present

## 2022-05-01 DIAGNOSIS — F1120 Opioid dependence, uncomplicated: Secondary | ICD-10-CM | POA: Diagnosis not present

## 2022-05-01 DIAGNOSIS — F1020 Alcohol dependence, uncomplicated: Secondary | ICD-10-CM | POA: Diagnosis not present

## 2022-05-01 DIAGNOSIS — F1220 Cannabis dependence, uncomplicated: Secondary | ICD-10-CM | POA: Diagnosis not present

## 2022-05-01 DIAGNOSIS — F1420 Cocaine dependence, uncomplicated: Secondary | ICD-10-CM | POA: Diagnosis not present

## 2022-05-02 DIAGNOSIS — F1120 Opioid dependence, uncomplicated: Secondary | ICD-10-CM | POA: Diagnosis not present

## 2022-05-02 DIAGNOSIS — F1420 Cocaine dependence, uncomplicated: Secondary | ICD-10-CM | POA: Diagnosis not present

## 2022-05-02 DIAGNOSIS — F1020 Alcohol dependence, uncomplicated: Secondary | ICD-10-CM | POA: Diagnosis not present

## 2022-05-02 DIAGNOSIS — F1220 Cannabis dependence, uncomplicated: Secondary | ICD-10-CM | POA: Diagnosis not present

## 2022-05-03 DIAGNOSIS — F1020 Alcohol dependence, uncomplicated: Secondary | ICD-10-CM | POA: Diagnosis not present

## 2022-05-03 DIAGNOSIS — F1220 Cannabis dependence, uncomplicated: Secondary | ICD-10-CM | POA: Diagnosis not present

## 2022-05-03 DIAGNOSIS — F1120 Opioid dependence, uncomplicated: Secondary | ICD-10-CM | POA: Diagnosis not present

## 2022-05-03 DIAGNOSIS — F1420 Cocaine dependence, uncomplicated: Secondary | ICD-10-CM | POA: Diagnosis not present

## 2022-07-17 DIAGNOSIS — Z5181 Encounter for therapeutic drug level monitoring: Secondary | ICD-10-CM | POA: Diagnosis not present

## 2022-07-17 DIAGNOSIS — Z79899 Other long term (current) drug therapy: Secondary | ICD-10-CM | POA: Diagnosis not present

## 2022-07-23 DIAGNOSIS — Z5181 Encounter for therapeutic drug level monitoring: Secondary | ICD-10-CM | POA: Diagnosis not present

## 2022-07-23 DIAGNOSIS — Z79899 Other long term (current) drug therapy: Secondary | ICD-10-CM | POA: Diagnosis not present

## 2022-07-29 DIAGNOSIS — Z5181 Encounter for therapeutic drug level monitoring: Secondary | ICD-10-CM | POA: Diagnosis not present

## 2022-07-29 DIAGNOSIS — Z79899 Other long term (current) drug therapy: Secondary | ICD-10-CM | POA: Diagnosis not present

## 2022-08-05 DIAGNOSIS — Z79899 Other long term (current) drug therapy: Secondary | ICD-10-CM | POA: Diagnosis not present

## 2022-08-05 DIAGNOSIS — Z5181 Encounter for therapeutic drug level monitoring: Secondary | ICD-10-CM | POA: Diagnosis not present

## 2022-09-06 DIAGNOSIS — Z5181 Encounter for therapeutic drug level monitoring: Secondary | ICD-10-CM | POA: Diagnosis not present

## 2022-09-06 DIAGNOSIS — Z79899 Other long term (current) drug therapy: Secondary | ICD-10-CM | POA: Diagnosis not present

## 2022-09-13 DIAGNOSIS — Z5181 Encounter for therapeutic drug level monitoring: Secondary | ICD-10-CM | POA: Diagnosis not present

## 2022-09-13 DIAGNOSIS — Z79899 Other long term (current) drug therapy: Secondary | ICD-10-CM | POA: Diagnosis not present

## 2022-09-15 DIAGNOSIS — Z79899 Other long term (current) drug therapy: Secondary | ICD-10-CM | POA: Diagnosis not present

## 2022-09-15 DIAGNOSIS — Z5181 Encounter for therapeutic drug level monitoring: Secondary | ICD-10-CM | POA: Diagnosis not present

## 2022-09-23 DIAGNOSIS — D649 Anemia, unspecified: Secondary | ICD-10-CM | POA: Diagnosis not present

## 2022-09-23 DIAGNOSIS — E785 Hyperlipidemia, unspecified: Secondary | ICD-10-CM | POA: Diagnosis not present

## 2022-09-23 DIAGNOSIS — Z5181 Encounter for therapeutic drug level monitoring: Secondary | ICD-10-CM | POA: Diagnosis not present

## 2022-09-23 DIAGNOSIS — F1120 Opioid dependence, uncomplicated: Secondary | ICD-10-CM | POA: Diagnosis not present

## 2022-09-23 DIAGNOSIS — E559 Vitamin D deficiency, unspecified: Secondary | ICD-10-CM | POA: Diagnosis not present

## 2022-09-23 DIAGNOSIS — Z79899 Other long term (current) drug therapy: Secondary | ICD-10-CM | POA: Diagnosis not present

## 2022-09-24 DIAGNOSIS — Z5181 Encounter for therapeutic drug level monitoring: Secondary | ICD-10-CM | POA: Diagnosis not present

## 2022-09-24 DIAGNOSIS — Z79899 Other long term (current) drug therapy: Secondary | ICD-10-CM | POA: Diagnosis not present

## 2022-09-26 DIAGNOSIS — Z5181 Encounter for therapeutic drug level monitoring: Secondary | ICD-10-CM | POA: Diagnosis not present

## 2022-09-26 DIAGNOSIS — Z79899 Other long term (current) drug therapy: Secondary | ICD-10-CM | POA: Diagnosis not present

## 2022-09-30 DIAGNOSIS — Z79899 Other long term (current) drug therapy: Secondary | ICD-10-CM | POA: Diagnosis not present

## 2022-09-30 DIAGNOSIS — Z5181 Encounter for therapeutic drug level monitoring: Secondary | ICD-10-CM | POA: Diagnosis not present

## 2022-10-05 DIAGNOSIS — Z5181 Encounter for therapeutic drug level monitoring: Secondary | ICD-10-CM | POA: Diagnosis not present

## 2022-10-05 DIAGNOSIS — Z79899 Other long term (current) drug therapy: Secondary | ICD-10-CM | POA: Diagnosis not present

## 2022-10-07 DIAGNOSIS — Z7151 Drug abuse counseling and surveillance of drug abuser: Secondary | ICD-10-CM | POA: Diagnosis not present

## 2022-10-07 DIAGNOSIS — Z79899 Other long term (current) drug therapy: Secondary | ICD-10-CM | POA: Diagnosis not present

## 2022-10-07 DIAGNOSIS — F1120 Opioid dependence, uncomplicated: Secondary | ICD-10-CM | POA: Diagnosis not present

## 2022-10-12 DIAGNOSIS — Z79899 Other long term (current) drug therapy: Secondary | ICD-10-CM | POA: Diagnosis not present

## 2022-10-12 DIAGNOSIS — Z5181 Encounter for therapeutic drug level monitoring: Secondary | ICD-10-CM | POA: Diagnosis not present

## 2022-10-15 DIAGNOSIS — Z79899 Other long term (current) drug therapy: Secondary | ICD-10-CM | POA: Diagnosis not present

## 2022-10-15 DIAGNOSIS — Z5181 Encounter for therapeutic drug level monitoring: Secondary | ICD-10-CM | POA: Diagnosis not present

## 2022-10-16 DIAGNOSIS — F1120 Opioid dependence, uncomplicated: Secondary | ICD-10-CM | POA: Diagnosis not present

## 2022-10-19 DIAGNOSIS — Z79899 Other long term (current) drug therapy: Secondary | ICD-10-CM | POA: Diagnosis not present

## 2022-10-19 DIAGNOSIS — Z5181 Encounter for therapeutic drug level monitoring: Secondary | ICD-10-CM | POA: Diagnosis not present

## 2022-10-21 DIAGNOSIS — F1120 Opioid dependence, uncomplicated: Secondary | ICD-10-CM | POA: Diagnosis not present

## 2022-10-21 DIAGNOSIS — Z5181 Encounter for therapeutic drug level monitoring: Secondary | ICD-10-CM | POA: Diagnosis not present

## 2022-10-21 DIAGNOSIS — Z79899 Other long term (current) drug therapy: Secondary | ICD-10-CM | POA: Diagnosis not present

## 2022-10-28 DIAGNOSIS — Z5181 Encounter for therapeutic drug level monitoring: Secondary | ICD-10-CM | POA: Diagnosis not present

## 2022-10-28 DIAGNOSIS — Z79899 Other long term (current) drug therapy: Secondary | ICD-10-CM | POA: Diagnosis not present

## 2022-11-02 DIAGNOSIS — Z79899 Other long term (current) drug therapy: Secondary | ICD-10-CM | POA: Diagnosis not present

## 2022-11-02 DIAGNOSIS — Z5181 Encounter for therapeutic drug level monitoring: Secondary | ICD-10-CM | POA: Diagnosis not present

## 2022-11-03 DIAGNOSIS — Z5181 Encounter for therapeutic drug level monitoring: Secondary | ICD-10-CM | POA: Diagnosis not present

## 2022-11-03 DIAGNOSIS — Z79899 Other long term (current) drug therapy: Secondary | ICD-10-CM | POA: Diagnosis not present

## 2022-11-11 DIAGNOSIS — F1120 Opioid dependence, uncomplicated: Secondary | ICD-10-CM | POA: Diagnosis not present

## 2022-11-11 DIAGNOSIS — Z79899 Other long term (current) drug therapy: Secondary | ICD-10-CM | POA: Diagnosis not present

## 2022-11-11 DIAGNOSIS — Z5181 Encounter for therapeutic drug level monitoring: Secondary | ICD-10-CM | POA: Diagnosis not present

## 2022-11-16 DIAGNOSIS — Z79899 Other long term (current) drug therapy: Secondary | ICD-10-CM | POA: Diagnosis not present

## 2022-11-16 DIAGNOSIS — Z5181 Encounter for therapeutic drug level monitoring: Secondary | ICD-10-CM | POA: Diagnosis not present

## 2022-11-18 DIAGNOSIS — Z79899 Other long term (current) drug therapy: Secondary | ICD-10-CM | POA: Diagnosis not present

## 2022-11-18 DIAGNOSIS — Z5181 Encounter for therapeutic drug level monitoring: Secondary | ICD-10-CM | POA: Diagnosis not present

## 2022-11-18 DIAGNOSIS — F1120 Opioid dependence, uncomplicated: Secondary | ICD-10-CM | POA: Diagnosis not present

## 2022-11-27 DIAGNOSIS — Z79899 Other long term (current) drug therapy: Secondary | ICD-10-CM | POA: Diagnosis not present

## 2022-11-27 DIAGNOSIS — Z5181 Encounter for therapeutic drug level monitoring: Secondary | ICD-10-CM | POA: Diagnosis not present

## 2022-12-02 DIAGNOSIS — Z5181 Encounter for therapeutic drug level monitoring: Secondary | ICD-10-CM | POA: Diagnosis not present

## 2022-12-02 DIAGNOSIS — Z79899 Other long term (current) drug therapy: Secondary | ICD-10-CM | POA: Diagnosis not present

## 2022-12-09 DIAGNOSIS — F1120 Opioid dependence, uncomplicated: Secondary | ICD-10-CM | POA: Diagnosis not present

## 2022-12-10 DIAGNOSIS — Z79899 Other long term (current) drug therapy: Secondary | ICD-10-CM | POA: Diagnosis not present

## 2022-12-10 DIAGNOSIS — Z5181 Encounter for therapeutic drug level monitoring: Secondary | ICD-10-CM | POA: Diagnosis not present

## 2022-12-16 DIAGNOSIS — F1120 Opioid dependence, uncomplicated: Secondary | ICD-10-CM | POA: Diagnosis not present

## 2022-12-16 DIAGNOSIS — Z79899 Other long term (current) drug therapy: Secondary | ICD-10-CM | POA: Diagnosis not present

## 2023-01-06 DIAGNOSIS — F1120 Opioid dependence, uncomplicated: Secondary | ICD-10-CM | POA: Diagnosis not present

## 2023-03-12 DIAGNOSIS — Z0000 Encounter for general adult medical examination without abnormal findings: Secondary | ICD-10-CM | POA: Diagnosis not present

## 2023-03-12 DIAGNOSIS — R739 Hyperglycemia, unspecified: Secondary | ICD-10-CM | POA: Diagnosis not present

## 2023-03-12 DIAGNOSIS — N529 Male erectile dysfunction, unspecified: Secondary | ICD-10-CM | POA: Diagnosis not present

## 2023-03-12 DIAGNOSIS — R519 Headache, unspecified: Secondary | ICD-10-CM | POA: Diagnosis not present

## 2023-03-12 DIAGNOSIS — R5383 Other fatigue: Secondary | ICD-10-CM | POA: Diagnosis not present

## 2023-03-12 DIAGNOSIS — F4310 Post-traumatic stress disorder, unspecified: Secondary | ICD-10-CM | POA: Diagnosis not present

## 2023-05-23 DIAGNOSIS — N529 Male erectile dysfunction, unspecified: Secondary | ICD-10-CM | POA: Diagnosis not present

## 2023-05-23 DIAGNOSIS — R5383 Other fatigue: Secondary | ICD-10-CM | POA: Diagnosis not present

## 2023-05-23 DIAGNOSIS — R7989 Other specified abnormal findings of blood chemistry: Secondary | ICD-10-CM | POA: Diagnosis not present
